# Patient Record
Sex: Female | Born: 2000 | Race: White | Hispanic: No | Marital: Single | Smoking: Never smoker
Health system: Southern US, Community
[De-identification: ages and names within clinical notes are randomized; demographics above are authoritative.]

## PROBLEM LIST (undated history)

## (undated) DIAGNOSIS — M1990 Unspecified osteoarthritis, unspecified site: Secondary | ICD-10-CM

## (undated) DIAGNOSIS — J45909 Unspecified asthma, uncomplicated: Secondary | ICD-10-CM

## (undated) DIAGNOSIS — M4306 Spondylolysis, lumbar region: Secondary | ICD-10-CM

## (undated) DIAGNOSIS — O139 Gestational [pregnancy-induced] hypertension without significant proteinuria, unspecified trimester: Secondary | ICD-10-CM

## (undated) HISTORY — PX: NO PAST SURGERIES: SHX2092

## (undated) HISTORY — DX: Spondylolysis, lumbar region: M43.06

---
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
## 2020-07-24 ENCOUNTER — Encounter: Payer: Self-pay | Admitting: Emergency Medicine

## 2020-07-24 ENCOUNTER — Other Ambulatory Visit: Payer: Self-pay

## 2020-07-24 ENCOUNTER — Emergency Department
Admission: EM | Admit: 2020-07-24 | Discharge: 2020-07-24 | Disposition: A | Discharge status: Home / Self Care | Payer: No Typology Code available for payment source | Attending: Emergency Medicine | Admitting: Emergency Medicine

## 2020-07-24 ENCOUNTER — Emergency Department: Payer: No Typology Code available for payment source

## 2020-07-24 DIAGNOSIS — R079 Chest pain, unspecified: Secondary | ICD-10-CM | POA: Diagnosis not present

## 2020-07-24 HISTORY — DX: Unspecified osteoarthritis, unspecified site: M19.90

## 2020-07-24 LAB — BASIC METABOLIC PANEL
Anion gap: 10 (ref 5–15)
BUN: 10 mg/dL (ref 6–20)
CO2: 24 mmol/L (ref 22–32)
Calcium: 9 mg/dL (ref 8.9–10.3)
Chloride: 106 mmol/L (ref 98–111)
Creatinine, Ser: 0.79 mg/dL (ref 0.44–1.00)
GFR, Estimated: >60 mL/min (ref >60)
Glucose, Bld: 90 mg/dL (ref 70–99)
Potassium: 3.6 mmol/L (ref 3.5–5.1)
Sodium: 140 mmol/L (ref 135–145)

## 2020-07-24 LAB — CBC
HCT: 43.4 % (ref 36.0–46.0)
Hemoglobin: 15 g/dL (ref 12.0–15.0)
MCH: 31.3 pg (ref 26.0–34.0)
MCHC: 34.6 g/dL (ref 30.0–36.0)
MCV: 90.4 fL (ref 80.0–100.0)
Platelets: 367 10*3/uL (ref 150–400)
RBC: 4.8 MIL/uL (ref 3.87–5.11)
RDW: 11.9 % (ref 11.5–15.5)
WBC: 7.5 10*3/uL (ref 4.0–10.5)
nRBC: 0 % (ref 0.0–0.2)

## 2020-07-24 LAB — POCT PREGNANCY, URINE: Preg Test, Ur: NEGATIVE

## 2020-07-24 LAB — HEPATIC FUNCTION PANEL
ALT: 16 U/L (ref 0–44)
AST: 17 U/L (ref 15–41)
Albumin: 4.1 g/dL (ref 3.5–5.0)
Alkaline Phosphatase: 72 U/L (ref 38–126)
Bilirubin, Direct: <0.1 mg/dL (ref 0.0–0.2)
Total Bilirubin: 0.6 mg/dL (ref 0.3–1.2)
Total Protein: 7.3 g/dL (ref 6.5–8.1)

## 2020-07-24 LAB — TROPONIN I (HIGH SENSITIVITY)
Troponin I (High Sensitivity): <2 ng/L (ref <18)
Troponin I (High Sensitivity): <2 ng/L (ref <18)

## 2020-07-24 LAB — LIPASE, BLOOD: Lipase: 41 U/L (ref 11–51)

## 2020-07-24 MED ORDER — ACETAMINOPHEN 500 MG PO TABS
1000.0000 mg | ORAL_TABLET | Freq: Once | ORAL | Status: AC
  Administered 2020-07-24: 1000 mg via ORAL
  Filled 2020-07-24: qty 2

## 2020-07-24 MED ORDER — PANTOPRAZOLE SODIUM 20 MG PO TBEC: 20.0000 mg | DELAYED_RELEASE_TABLET | Freq: Every day | ORAL | 1 refills | Status: DC

## 2020-07-24 NOTE — ED Notes (Signed)
Discharge instructions reviewed with patient , pt AOx4, denied pain or sob

## 2020-07-24 NOTE — ED Triage Notes (Signed)
PT to ER with c/o intermittent chest pain for last several weeks.  States last night and again today it was more severe than normal.  Pt reports nausea and diaphoresis.  States pain is beginning to subside at this time.Pt reports midsternal pain that radiates to left shoulder and center of back.

## 2020-07-24 NOTE — ED Provider Notes (Signed)
[facility] Emergency Department Provider Note  Time seen: 4:55 PM  I have reviewed the triage vital signs and the nursing notes.   HISTORY  Chief Complaint Chest Pain   HPI Tanya Schmitt is a 19 y.o. female with no past medical history presents emergency department for chest pain.  According to the patient for the past several weeks she has been experiencing intermittent pain in the center of her chest radiating to her back her left shoulder at times.  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  Patient denies any fever cough or shortness of breath.  Denies any history of cardiac disease.  No leg pain or swelling.  Does not take birth control.  No pleuritic pain.   Patient states the pain is worse at night when she lies down.  Denies any chest pain currently.   Past Medical History:  Diagnosis Date  . Arthritis     There are no problems to display for this patient.   History reviewed. No pertinent surgical history.  Prior to Admission medications   Not on File    Allergies  Allergen Reactions  . Keflex [Cephalexin] Hives  . Penicillins Hives  . Sulfa Antibiotics Hives    History reviewed. No pertinent family history.  Social History Social History   Tobacco Use  . Smoking status: Never Smoker  . Smokeless tobacco: Never Used  Substance Use Topics  . Alcohol use: Not on file  . Drug use: Not on file    Review of Systems Constitutional: Negative for fever. Cardiovascular: Intermittent mild "tension" type pain in the center of her chest. Respiratory: Negative for shortness of breath. Gastrointestinal: Negative for abdominal pain, vomiting  Genitourinary: Negative for urinary compaints Musculoskeletal: Negative for musculoskeletal  complaints.  Negative for leg pain or swelling. Skin: Negative for skin complaints  Neurological: Negative for headache All other ROS negative  ____________________________________________   PHYSICAL EXAM:  VITAL SIGNS: ED Triage Vitals [07/24/20 1325]  Enc Vitals Group     BP 102/67     Pulse Rate (!) 111     Resp 18     Temp 98.2 F (36.8 C)     Temp Source Oral     SpO2 99 %     Weight 250 lb (113.4 kg)     Height 5\' 3"  (1.6 m)     Head Circumference      Peak Flow      Pain Score 5     Pain Loc      Pain Edu?      Excl. in GC?    Constitutional: Alert and oriented. Well appearing and in no distress. Eyes: Normal exam ENT      Head: Normocephalic and atraumatic.      Mouth/Throat: Mucous membranes are moist. Cardiovascular: Normal rate, regular rhythm.  Respiratory: Normal respiratory effort without tachypnea nor retractions. Breath sounds are clear Gastrointestinal: Soft and nontender. No distention.   Musculoskeletal: Nontender with normal range of motion in all extremities. No lower extremity tenderness or edema. Neurologic:  Normal speech and language. No gross focal neurologic deficit Skin:  Skin is warm, dry and intact.  Psychiatric: Mood and affect are normal.   ____________________________________________    EKG  EKG viewed and interpreted by myself shows sinus tachycardia  104 bpm with a narrow QRS, normal axis, normal intervals, no concerning ST changes.  ____________________________________________    RADIOLOGY  Chest x-ray is negative  ____________________________________________   INITIAL IMPRESSION / ASSESSMENT AND PLAN / ED COURSE  Pertinent labs & imaging results that were available during my care of the patient were reviewed by me and considered in my medical decision making (see chart for details).   Patient presents to the emergency department for intermittent chest pain over the past several weeks.  Patient states it occurred today  and she began feeling a pins-and-needles sensation checked her vital signs and they were abnormal so she came to the emergency department.  Denies any chest pain currently.  Overall the patient does appear well, chest pain is reproducible on palpation of the chest.  EKG is reassuring, chest x-ray appears normal.  Lab work is large within normal limits including a negative troponin.  Patient did have slight epigastric tenderness on my examination as well.  I have added on a lipase and hepatic function panel as a precaution.  If these are normal I do believe the patient will be safe for discharge home with PCP follow-up.  Patient agreeable to plan of care.  Given the pain occurs mostly at night and the patient does have a history of gastric reflux per patient we will prescribe a PPI if her remaining work-up is negative.  LFTs and lipase are negative.  We will place the patient on Protonix and have the patient follow-up with her PCP.  Patient agreeable to plan of care.  Discussed my typical chest pain return precautions.  Tanya Schmitt was evaluated in Emergency Department on 07/24/2020 for the symptoms described in the history of present illness. She was evaluated in the context of the global COVID-19 pandemic, which necessitated consideration that the patient might be at risk for infection with the SARS-CoV-2 virus that causes COVID-19. Institutional protocols and algorithms that pertain to the evaluation of patients at risk for COVID-19 are in a state of rapid change based on information released by regulatory bodies including the CDC and federal and state organizations. These policies and algorithms were followed during the patient's care in the ED.  ____________________________________________   FINAL CLINICAL IMPRESSION(S) / ED DIAGNOSES  Chest pain   Minna Antis, MD 07/24/20 1757

## 2020-10-17 NOTE — L&D Delivery Note (Signed)
Delivery Note At 9:19 PM a viable female was delivered via Vaginal, Spontaneous (Presentation: ROA).  APGAR: 8, 9; weight pending.   Placenta status: Spontaneous, Intact. 3V Cord  with the following complications: none.  Cord pH: n/a  Anesthesia: Epidural Episiotomy: None Lacerations:  vaginal and bilateral periurethral Suture Repair: 3.0 vicryl rapide Est. Blood Loss (mL):  150  Mom to postpartum.  Baby to Couplet care / Skin to Skin.  Mitchel Honour 07/07/2021, 9:49 PM

## 2020-12-10 LAB — OB RESULTS CONSOLE RUBELLA ANTIBODY, IGM: Rubella: IMMUNE

## 2021-04-21 LAB — OB RESULTS CONSOLE HIV ANTIBODY (ROUTINE TESTING): HIV: NONREACTIVE

## 2021-06-25 ENCOUNTER — Encounter: Payer: Self-pay | Admitting: Advanced Practice Midwife

## 2021-06-25 ENCOUNTER — Inpatient Hospital Stay (HOSPITAL_COMMUNITY)
Admission: AD | Admit: 2021-06-25 | Discharge: 2021-06-25 | Disposition: A | Discharge status: Home / Self Care | Payer: No Typology Code available for payment source | Attending: Obstetrics and Gynecology | Admitting: Obstetrics and Gynecology

## 2021-06-25 ENCOUNTER — Other Ambulatory Visit: Payer: Self-pay

## 2021-06-25 DIAGNOSIS — Z3A36 36 weeks gestation of pregnancy: Secondary | ICD-10-CM | POA: Diagnosis not present

## 2021-06-25 DIAGNOSIS — N898 Other specified noninflammatory disorders of vagina: Secondary | ICD-10-CM | POA: Insufficient documentation

## 2021-06-25 DIAGNOSIS — Z881 Allergy status to other antibiotic agents status: Secondary | ICD-10-CM | POA: Insufficient documentation

## 2021-06-25 DIAGNOSIS — Z882 Allergy status to sulfonamides status: Secondary | ICD-10-CM | POA: Diagnosis not present

## 2021-06-25 DIAGNOSIS — R109 Unspecified abdominal pain: Secondary | ICD-10-CM

## 2021-06-25 DIAGNOSIS — R1084 Generalized abdominal pain: Secondary | ICD-10-CM | POA: Insufficient documentation

## 2021-06-25 DIAGNOSIS — O26893 Other specified pregnancy related conditions, third trimester: Secondary | ICD-10-CM | POA: Diagnosis not present

## 2021-06-25 DIAGNOSIS — Z880 Allergy status to penicillin: Secondary | ICD-10-CM | POA: Insufficient documentation

## 2021-06-25 DIAGNOSIS — I10 Essential (primary) hypertension: Secondary | ICD-10-CM | POA: Diagnosis not present

## 2021-06-25 DIAGNOSIS — R141 Gas pain: Secondary | ICD-10-CM | POA: Insufficient documentation

## 2021-06-25 HISTORY — DX: Unspecified asthma, uncomplicated: J45.909

## 2021-06-25 LAB — URINALYSIS, ROUTINE W REFLEX MICROSCOPIC
Bilirubin Urine: NEGATIVE
Glucose, UA: NEGATIVE mg/dL
Hgb urine dipstick: NEGATIVE
Ketones, ur: NEGATIVE mg/dL
Leukocytes,Ua: NEGATIVE
Nitrite: NEGATIVE
Protein, ur: NEGATIVE mg/dL
Specific Gravity, Urine: 1.009 (ref 1.005–1.030)
pH: 6 (ref 5.0–8.0)

## 2021-06-25 LAB — COMPREHENSIVE METABOLIC PANEL
ALT: 11 U/L (ref 0–44)
AST: 16 U/L (ref 15–41)
Albumin: 2.7 g/dL — ABNORMAL LOW (ref 3.5–5.0)
Alkaline Phosphatase: 122 U/L (ref 38–126)
Anion gap: 8 (ref 5–15)
BUN: 7 mg/dL (ref 6–20)
CO2: 22 mmol/L (ref 22–32)
Calcium: 8.8 mg/dL — ABNORMAL LOW (ref 8.9–10.3)
Chloride: 106 mmol/L (ref 98–111)
Creatinine, Ser: 0.57 mg/dL (ref 0.44–1.00)
GFR, Estimated: >60 mL/min (ref >60)
Glucose, Bld: 93 mg/dL (ref 70–99)
Potassium: 3.6 mmol/L (ref 3.5–5.1)
Sodium: 136 mmol/L (ref 135–145)
Total Bilirubin: 0.4 mg/dL (ref 0.3–1.2)
Total Protein: 6.3 g/dL — ABNORMAL LOW (ref 6.5–8.1)

## 2021-06-25 LAB — CBC
HCT: 32.3 % — ABNORMAL LOW (ref 36.0–46.0)
Hemoglobin: 10.8 g/dL — ABNORMAL LOW (ref 12.0–15.0)
MCH: 31 pg (ref 26.0–34.0)
MCHC: 33.4 g/dL (ref 30.0–36.0)
MCV: 92.8 fL (ref 80.0–100.0)
Platelets: 287 10*3/uL (ref 150–400)
RBC: 3.48 MIL/uL — ABNORMAL LOW (ref 3.87–5.11)
RDW: 12.6 % (ref 11.5–15.5)
WBC: 11.6 10*3/uL — ABNORMAL HIGH (ref 4.0–10.5)
nRBC: 0 % (ref 0.0–0.2)

## 2021-06-25 LAB — PROTEIN / CREATININE RATIO, URINE
Creatinine, Urine: 64.05 mg/dL
Protein Creatinine Ratio: 0.09 mg/mg{Cre} (ref 0.00–0.15)
Total Protein, Urine: 6 mg/dL

## 2021-06-25 LAB — AMNISURE RUPTURE OF MEMBRANE (ROM) NOT AT ARMC: Amnisure ROM: NEGATIVE

## 2021-06-25 MED ORDER — SIMETHICONE 80 MG PO CHEW: 80.0000 mg | CHEWABLE_TABLET | Freq: Four times a day (QID) | ORAL | 0 refills | Status: DC | PRN

## 2021-06-25 MED ORDER — SIMETHICONE 80 MG PO CHEW
160.0000 mg | CHEWABLE_TABLET | Freq: Once | ORAL | Status: AC
  Administered 2021-06-25: 160 mg via ORAL
  Filled 2021-06-25: qty 2

## 2021-06-25 NOTE — Progress Notes (Signed)
Marie Williams CNM in earlier to discuss test results and d/c plan. Written and verbal d/c instructions given and understanding voiced °

## 2021-06-25 NOTE — MAU Provider Note (Signed)
Chief Complaint:  Abdominal Pain   Event Date/Time   First Provider Initiated Contact with Patient 06/25/21 0110     HPI: Tanya Schmitt is a 20 y.o. G1P0 at 79w0dwho presents to maternity admissions reporting intermittent sharp abdominal pain (now resolved, thinks is gas pains),  and possible leaking of fluid today "like I peed on myself".  . She reports good fetal movement, denies vaginal bleeding, vaginal itching/burning, urinary symptoms, h/a, dizziness, n/v, diarrhea, constipation or fever/chills.  She denies headache, visual changes or RUQ abdominal pain.  Abdominal Pain This is a new problem. The current episode started today. The problem occurs intermittently. The problem has been resolved since onset. The pain is located in the generalized abdominal region. The quality of the pain is described as sharp (gas pains). The pain does not radiate. Pertinent negatives include no constipation, diarrhea, dysuria, fever or frequency. Nothing relieves the symptoms. Past treatments include nothing.   RN Note: Having some sharp pain that comes and goes in mid abdomen. Thought it was gas and tried to have BM but could not. Had normal BM earlier today. Denies VB but has had some leaking on occ last couple days. Thought may be urine but pt unsure.  Past Medical History: Past Medical History:  Diagnosis Date   Arthritis    Asthma     Past obstetric history: OB History  Gravida Para Term Preterm AB Living  1            SAB IAB Ectopic Multiple Live Births               # Outcome Date GA Lbr Len/2nd Weight Sex Delivery Anes PTL Lv  1 Current             Past Surgical History: Past Surgical History:  Procedure Laterality Date   NO PAST SURGERIES      Family History: Family History  Problem Relation Age of Onset   Stroke Mother    Heart attack Mother     Social History: Social History   Tobacco Use   Smoking status: Never   Smokeless tobacco: Never  Vaping Use   Vaping Use:  Never used  Substance Use Topics   Alcohol use: Never   Drug use: Never    Allergies:  Allergies  Allergen Reactions   Keflex [Cephalexin] Hives   Penicillins Hives   Sulfa Antibiotics Hives    Meds:  Medications Prior to Admission  Medication Sig Dispense Refill Last Dose   pantoprazole (PROTONIX) 20 MG tablet Take 1 tablet (20 mg total) by mouth daily. 30 tablet 1     I have reviewed patient's Past Medical Hx, Surgical Hx, Family Hx, Social Hx, medications and allergies.   ROS:  Review of Systems  Constitutional:  Negative for fever.  Gastrointestinal:  Positive for abdominal pain. Negative for constipation and diarrhea.  Genitourinary:  Negative for dysuria and frequency.  Other systems negative  Physical Exam  Patient Vitals for the past 24 hrs:  BP Temp Pulse Resp SpO2 Height Weight  06/25/21 0058 140/83 -- (!) 106 -- -- -- --  06/25/21 0045 130/89 -- (!) 122 -- -- -- --  06/25/21 0025 133/82 -- (!) 111 -- 99 % -- --  06/25/21 0024 -- 98 F (36.7 C) -- 20 -- 5\' 3"  (1.6 m) 116.1 kg   Constitutional: Well-developed, well-nourished female in no acute distress.  Cardiovascular: normal rate and rhythm Respiratory: normal effort, clear to auscultation bilaterally GI: Abd soft,  non-tender, gravid appropriate for gestational age.   No rebound or guarding. MS: Extremities nontender, no edema, normal ROM Neurologic: Alert and oriented x 4.  GU: Neg CVAT.  PELVIC EXAM:   Dilation: Closed Effacement (%): 40 Exam by:: Wynelle Bourgeois CNM Amnisure sent  FHT:  Baseline 140 , moderate variability, accelerations present, no decelerations Contractions:  Irregular, not painful    Labs: Results for orders placed or performed during the hospital encounter of 06/25/21 (from the past 24 hour(s))  Urinalysis, Routine w reflex microscopic Urine, Clean Catch     Status: None   Collection Time: 06/25/21 12:35 AM  Result Value Ref Range   Color, Urine YELLOW YELLOW   APPearance  CLEAR CLEAR   Specific Gravity, Urine 1.009 1.005 - 1.030   pH 6.0 5.0 - 8.0   Glucose, UA NEGATIVE NEGATIVE mg/dL   Hgb urine dipstick NEGATIVE NEGATIVE   Bilirubin Urine NEGATIVE NEGATIVE   Ketones, ur NEGATIVE NEGATIVE mg/dL   Protein, ur NEGATIVE NEGATIVE mg/dL   Nitrite NEGATIVE NEGATIVE   Leukocytes,Ua NEGATIVE NEGATIVE  Protein / creatinine ratio, urine     Status: None   Collection Time: 06/25/21 12:35 AM  Result Value Ref Range   Creatinine, Urine 64.05 mg/dL   Total Protein, Urine 6 mg/dL   Protein Creatinine Ratio 0.09 0.00 - 0.15 mg/mg[Cre]  Amnisure rupture of membrane (rom)not at [facility]     Status: None   Collection Time: 06/25/21  1:20 AM  Result Value Ref Range   Amnisure ROM NEGATIVE   CBC     Status: Abnormal   Collection Time: 06/25/21  2:00 AM  Result Value Ref Range   WBC 11.6 (H) 4.0 - 10.5 K/uL   RBC 3.48 (L) 3.87 - 5.11 MIL/uL   Hemoglobin 10.8 (L) 12.0 - 15.0 g/dL   HCT 10.2 (L) 72.5 - 36.6 %   MCV 92.8 80.0 - 100.0 fL   MCH 31.0 26.0 - 34.0 pg   MCHC 33.4 30.0 - 36.0 g/dL   RDW 44.0 34.7 - 42.5 %   Platelets 287 150 - 400 K/uL   nRBC 0.0 0.0 - 0.2 %  Comprehensive metabolic panel     Status: Abnormal   Collection Time: 06/25/21  2:00 AM  Result Value Ref Range   Sodium 136 135 - 145 mmol/L   Potassium 3.6 3.5 - 5.1 mmol/L   Chloride 106 98 - 111 mmol/L   CO2 22 22 - 32 mmol/L   Glucose, Bld 93 70 - 99 mg/dL   BUN 7 6 - 20 mg/dL   Creatinine, Ser 9.56 0.44 - 1.00 mg/dL   Calcium 8.8 (L) 8.9 - 10.3 mg/dL   Total Protein 6.3 (L) 6.5 - 8.1 g/dL   Albumin 2.7 (L) 3.5 - 5.0 g/dL   AST 16 15 - 41 U/L   ALT 11 0 - 44 U/L   Alkaline Phosphatase 122 38 - 126 U/L   Total Bilirubin 0.4 0.3 - 1.2 mg/dL   GFR, Estimated >38 >75 mL/min   Anion gap 8 5 - 15   Imaging:  No results found.  MAU Course/MDM: I have ordered labs and reviewed results. Labs are normal Normal Pr/Cr ratio NST reviewed, reactive Treatments in MAU included EFM, simethicone     Assessment: Single IUP at [redacted]w[redacted]d Abdominal gas pains Intermittent hypertension, pt states they are following in office No signs of preeclampsia  Plan: Discharge home Rx Simethicone for gas pains Preeclampsia precautions Labor precautions and fetal kick counts  Follow up in Office for prenatal visits and recheck Encouraged to return if she develops worsening of symptoms, increase in pain, fever, or other concerning symptoms.  Pt stable at time of discharge.  Wynelle Bourgeois CNM, MSN Certified Nurse-Midwife 06/25/2021 1:10 AM

## 2021-06-25 NOTE — MAU Note (Signed)
OK to d/c EFM per M Williams CNM 

## 2021-06-25 NOTE — MAU Note (Signed)
Having some sharp pain that comes and goes in mid abdomen. Thought it was gas and tried to have BM but could not. Had normal BM earlier today. Denies VB but has had some leaking on occ last couple days. Thought may be urine but pt unsure.

## 2021-06-25 NOTE — MAU Note (Signed)
Pt states she was to take baby asa but she was bleeding so easily she stopped it. She was on Labetalol 2nd trimester and was told to stop taking it that it was not making a difference.

## 2021-06-30 LAB — OB RESULTS CONSOLE GBS: GBS: NEGATIVE

## 2021-07-01 ENCOUNTER — Inpatient Hospital Stay (HOSPITAL_COMMUNITY)
Admission: AD | Admit: 2021-07-01 | Discharge: 2021-07-01 | Disposition: A | Discharge status: Home / Self Care | Payer: No Typology Code available for payment source | Attending: Obstetrics and Gynecology | Admitting: Obstetrics and Gynecology

## 2021-07-01 ENCOUNTER — Encounter (HOSPITAL_COMMUNITY): Payer: Self-pay | Admitting: Obstetrics and Gynecology

## 2021-07-01 DIAGNOSIS — Z3A36 36 weeks gestation of pregnancy: Secondary | ICD-10-CM | POA: Insufficient documentation

## 2021-07-01 DIAGNOSIS — Z3403 Encounter for supervision of normal first pregnancy, third trimester: Secondary | ICD-10-CM | POA: Diagnosis not present

## 2021-07-01 DIAGNOSIS — Z0371 Encounter for suspected problem with amniotic cavity and membrane ruled out: Secondary | ICD-10-CM | POA: Diagnosis not present

## 2021-07-01 LAB — OB RESULTS CONSOLE GC/CHLAMYDIA
Chlamydia: NEGATIVE
Gonorrhea: NEGATIVE

## 2021-07-01 LAB — POCT FERN TEST: POCT Fern Test: NEGATIVE

## 2021-07-01 NOTE — MAU Note (Signed)
Pt reports she has a gush of fluid around 330pm. Clear fluid. Reports some ctx. Good fetal movement. Denies any vag bleeding.

## 2021-07-01 NOTE — MAU Provider Note (Signed)
Event Date/Time   First Provider Initiated Contact with Patient 07/01/21 1817       S: Ms. AISHA GREENBERGER is a 20 y.o. G1P0 at [redacted]w[redacted]d  who presents to MAU today complaining of leaking of fluid since. Reports a gush of fluid earlier today that caused her underwear to be wet. No odor. Denies contractions, dysuria, vaginal discharge, vaginal irritation, or vaginal bleeding. No recent intercourse. Good fetal movement.   O: BP 113/80 (BP Location: Right Arm)   Pulse (!) 112   Temp 98.2 F (36.8 C)   Resp 17   LMP 07/14/2020   SpO2 99%  GENERAL: Well-developed, well-nourished female in no acute distress.  HEAD: Normocephalic, atraumatic.  CHEST: Normal effort of breathing, regular heart rate ABDOMEN: Soft, nontender, gravid PELVIC: Normal external female genitalia. Vagina is pink and rugated. Cervix with normal contour, no lesions. Normal discharge.  No pooling.   Cervical exam: not indicated     Fetal Monitoring: Baseline: 135 Variability: moderate Accelerations: 15x15 Decelerations: none Contractions: irregular  Results for orders placed or performed during the hospital encounter of 07/01/21 (from the past 24 hour(s))  POCT fern test     Status: None   Collection Time: 07/01/21  6:37 PM  Result Value Ref Range   POCT Fern Test Negative = intact amniotic membranes      A: SIUP at [redacted]w[redacted]d  Membranes intact  P: Discharge home Labor precautions Keep f/u with Virgia Land, NP 07/01/2021 6:37 PM

## 2021-07-05 NOTE — H&P (Signed)
Tanya Schmitt is a 20 y.o. female presenting for IOL. Pregnancy complicated by elevated BP. Evaluated twice in MAU for BP. Today at PCP office BP = 173/91, 123/101. Today in our office BP = 132/88, ptn trace. She has some HA on/off and some puffiness in her face. OB History     Gravida  1   Para      Term      Preterm      AB      Living         SAB      IAB      Ectopic      Multiple      Live Births             Past Medical History:  Diagnosis Date   Arthritis    Asthma    Past Surgical History:  Procedure Laterality Date   NO PAST SURGERIES     Family History: family history includes Heart attack in her mother; Stroke in her mother. Social History:  reports that she has never smoked. She has never used smokeless tobacco. She reports that she does not drink alcohol and does not use drugs.     Maternal Diabetes: No Genetic Screening: Normal Maternal Ultrasounds/Referrals: Normal Fetal Ultrasounds or other Referrals:  None Maternal Substance Abuse:  No Significant Maternal Medications:  None Significant Maternal Lab Results:  Group B Strep negative Other Comments:  None  Review of Systems  Constitutional:  Negative for fever.  Eyes:  Positive for visual disturbance.       Blurred vision off/on  Gastrointestinal:  Negative for abdominal pain.  Maternal Medical History:  Fetal activity: Perceived fetal activity is normal.      Last menstrual period 07/14/2020. Maternal Exam:  Abdomen: Fetal presentation: vertex  Physical Exam Cardiovascular:     Rate and Rhythm: Normal rate.  Pulmonary:     Effort: Pulmonary effort is normal.    Prenatal labs: ABO, Rh:   Antibody:   Rubella:   RPR:    HBsAg:    HIV:    GBS:   negative 06/30/21  Assessment/Plan: 20 yo G1P0 with gestational hypertension becoming more labile Admitted for two stage IOL   Roselle Locus II 07/05/2021, 6:17 PM

## 2021-07-06 ENCOUNTER — Inpatient Hospital Stay (HOSPITAL_COMMUNITY)
Admission: AD | Admit: 2021-07-06 | Discharge: 2021-07-09 | DRG: 807 | Disposition: A | Discharge status: Home / Self Care | Payer: No Typology Code available for payment source | Attending: Obstetrics & Gynecology | Admitting: Obstetrics & Gynecology

## 2021-07-06 ENCOUNTER — Encounter (HOSPITAL_COMMUNITY): Payer: Self-pay | Admitting: Obstetrics and Gynecology

## 2021-07-06 ENCOUNTER — Inpatient Hospital Stay (HOSPITAL_COMMUNITY): Payer: No Typology Code available for payment source

## 2021-07-06 ENCOUNTER — Other Ambulatory Visit: Payer: Self-pay

## 2021-07-06 DIAGNOSIS — Z3A37 37 weeks gestation of pregnancy: Secondary | ICD-10-CM | POA: Diagnosis not present

## 2021-07-06 DIAGNOSIS — O139 Gestational [pregnancy-induced] hypertension without significant proteinuria, unspecified trimester: Secondary | ICD-10-CM | POA: Diagnosis present

## 2021-07-06 DIAGNOSIS — Z3490 Encounter for supervision of normal pregnancy, unspecified, unspecified trimester: Secondary | ICD-10-CM

## 2021-07-06 DIAGNOSIS — O99214 Obesity complicating childbirth: Secondary | ICD-10-CM | POA: Diagnosis present

## 2021-07-06 DIAGNOSIS — O134 Gestational [pregnancy-induced] hypertension without significant proteinuria, complicating childbirth: Secondary | ICD-10-CM | POA: Diagnosis present

## 2021-07-06 DIAGNOSIS — Z20822 Contact with and (suspected) exposure to covid-19: Secondary | ICD-10-CM | POA: Diagnosis present

## 2021-07-06 DIAGNOSIS — R030 Elevated blood-pressure reading, without diagnosis of hypertension: Secondary | ICD-10-CM | POA: Diagnosis present

## 2021-07-06 HISTORY — DX: Gestational (pregnancy-induced) hypertension without significant proteinuria, unspecified trimester: O13.9

## 2021-07-06 LAB — TYPE AND SCREEN
ABO/RH(D): B NEG
Antibody Screen: POSITIVE

## 2021-07-06 LAB — CBC
HCT: 32 % — ABNORMAL LOW (ref 36.0–46.0)
Hemoglobin: 10.7 g/dL — ABNORMAL LOW (ref 12.0–15.0)
MCH: 30.7 pg (ref 26.0–34.0)
MCHC: 33.4 g/dL (ref 30.0–36.0)
MCV: 92 fL (ref 80.0–100.0)
Platelets: 294 10*3/uL (ref 150–400)
RBC: 3.48 MIL/uL — ABNORMAL LOW (ref 3.87–5.11)
RDW: 12.7 % (ref 11.5–15.5)
WBC: 11.5 10*3/uL — ABNORMAL HIGH (ref 4.0–10.5)
nRBC: 0 % (ref 0.0–0.2)

## 2021-07-06 LAB — COMPREHENSIVE METABOLIC PANEL
ALT: 9 U/L (ref 0–44)
AST: 20 U/L (ref 15–41)
Albumin: 2.8 g/dL — ABNORMAL LOW (ref 3.5–5.0)
Alkaline Phosphatase: 138 U/L — ABNORMAL HIGH (ref 38–126)
Anion gap: 11 (ref 5–15)
BUN: 5 mg/dL — ABNORMAL LOW (ref 6–20)
CO2: 19 mmol/L — ABNORMAL LOW (ref 22–32)
Calcium: 8.7 mg/dL — ABNORMAL LOW (ref 8.9–10.3)
Chloride: 105 mmol/L (ref 98–111)
Creatinine, Ser: 0.52 mg/dL (ref 0.44–1.00)
GFR, Estimated: >60 mL/min (ref >60)
Glucose, Bld: 87 mg/dL (ref 70–99)
Potassium: 3.6 mmol/L (ref 3.5–5.1)
Sodium: 135 mmol/L (ref 135–145)
Total Bilirubin: 0.6 mg/dL (ref 0.3–1.2)
Total Protein: 6.1 g/dL — ABNORMAL LOW (ref 6.5–8.1)

## 2021-07-06 LAB — RESP PANEL BY RT-PCR (FLU A&B, COVID) ARPGX2
Influenza A by PCR: NEGATIVE
Influenza B by PCR: NEGATIVE
SARS Coronavirus 2 by RT PCR: NEGATIVE

## 2021-07-06 LAB — RPR: RPR Ser Ql: NONREACTIVE

## 2021-07-06 MED ORDER — OXYTOCIN-SODIUM CHLORIDE 30-0.9 UT/500ML-% IV SOLN: 2.5000 [IU]/h | INTRAVENOUS | Status: DC

## 2021-07-06 MED ORDER — MISOPROSTOL 25 MCG QUARTER TABLET
25.0000 ug | ORAL_TABLET | ORAL | Status: DC | PRN
  Administered 2021-07-06 (×2): 25 ug via VAGINAL
  Filled 2021-07-06 (×2): qty 1

## 2021-07-06 MED ORDER — LACTATED RINGERS IV SOLN: 500.0000 mL | INTRAVENOUS | Status: DC | PRN

## 2021-07-06 MED ORDER — OXYTOCIN BOLUS FROM INFUSION
333.0000 mL | Freq: Once | INTRAVENOUS | Status: AC
  Administered 2021-07-07: 333 mL via INTRAVENOUS

## 2021-07-06 MED ORDER — FENTANYL CITRATE (PF) 100 MCG/2ML IJ SOLN
50.0000 ug | INTRAMUSCULAR | Status: DC | PRN
  Administered 2021-07-07: 100 ug via INTRAVENOUS
  Filled 2021-07-06: qty 2

## 2021-07-06 MED ORDER — TERBUTALINE SULFATE 1 MG/ML IJ SOLN: 0.2500 mg | Freq: Once | INTRAMUSCULAR | Status: DC | PRN

## 2021-07-06 MED ORDER — OXYCODONE-ACETAMINOPHEN 5-325 MG PO TABS
1.0000 | ORAL_TABLET | ORAL | Status: DC | PRN
  Administered 2021-07-07: 1 via ORAL
  Filled 2021-07-06: qty 1

## 2021-07-06 MED ORDER — LACTATED RINGERS IV SOLN: INTRAVENOUS | Status: DC

## 2021-07-06 MED ORDER — FLEET ENEMA 7-19 GM/118ML RE ENEM: 1.0000 | ENEMA | RECTAL | Status: DC | PRN

## 2021-07-06 MED ORDER — SOD CITRATE-CITRIC ACID 500-334 MG/5ML PO SOLN: 30.0000 mL | ORAL | Status: DC | PRN

## 2021-07-06 MED ORDER — ZOLPIDEM TARTRATE 5 MG PO TABS: 5.0000 mg | ORAL_TABLET | Freq: Every evening | ORAL | Status: DC | PRN

## 2021-07-06 MED ORDER — ONDANSETRON HCL 4 MG/2ML IJ SOLN
4.0000 mg | Freq: Four times a day (QID) | INTRAMUSCULAR | Status: DC | PRN
  Administered 2021-07-07 (×2): 4 mg via INTRAVENOUS
  Filled 2021-07-06 (×2): qty 2

## 2021-07-06 MED ORDER — OXYCODONE-ACETAMINOPHEN 5-325 MG PO TABS: 2.0000 | ORAL_TABLET | ORAL | Status: DC | PRN

## 2021-07-06 MED ORDER — MISOPROSTOL 50MCG HALF TABLET
50.0000 ug | ORAL_TABLET | ORAL | Status: DC | PRN
  Administered 2021-07-06 (×3): 50 ug via ORAL
  Filled 2021-07-06 (×3): qty 1

## 2021-07-06 MED ORDER — LIDOCAINE HCL (PF) 1 % IJ SOLN: 30.0000 mL | INTRAMUSCULAR | Status: DC | PRN

## 2021-07-06 MED ORDER — ACETAMINOPHEN 325 MG PO TABS
650.0000 mg | ORAL_TABLET | ORAL | Status: DC | PRN
Start: 2021-07-06 — End: 2021-07-07

## 2021-07-06 NOTE — Progress Notes (Signed)
Patient is hungry No HA, no vision change  Today's Vitals   07/06/21 1802 07/06/21 1900 07/06/21 2002 07/06/21 2102  BP: 130/77 135/74 126/82 126/75  Pulse: 98 94 (!) 101 91  Resp:      Temp:      TempSrc:      SpO2:      Weight:      Height:      PainSc:   5     Body mass index is 45.37 kg/m.   FHT cat one UCs q2-5 min, she is feeling some of them Cx 1/60/-2/vtx/a little softer  A/P: D/W options         Will hold misoprostol and she will eat a meal         If no spontaneous change will check cervix at 5am

## 2021-07-06 NOTE — Progress Notes (Signed)
FHT cat one UCs q2-4 min, mild Cx FT/50/-2/vtx/ballot/med soft  Today's Vitals   07/06/21 1402 07/06/21 1502 07/06/21 1602 07/06/21 1701  BP: 139/90 117/73 (!) 115/59 121/72  Pulse: (!) 107 (!) 102 95 100  Resp:      Temp:      TempSrc:      SpO2:      Weight:      Height:      PainSc:       Body mass index is 45.37 kg/m.   A/P: S/P cytotec vag x 2 and oral x 2         Repeat cytotec oral

## 2021-07-06 NOTE — Plan of Care (Signed)

## 2021-07-06 NOTE — Progress Notes (Signed)
No HA, no vision change, no ROM  Today's Vitals   07/06/21 0126 07/06/21 0521 07/06/21 0522 07/06/21 0656  BP:  133/69  129/75  Pulse:  97  93  Resp:  18  16  Temp:   98.1 F (36.7 C)   TempSrc:   Oral   SpO2: 98%     Weight:      Height:      PainSc:  Asleep  Asleep   Body mass index is 45.37 kg/m.  DTR 2+  FHT cat one UCs q2-4 min, mild/mod  Cytotec x 2  Results for orders placed or performed during the hospital encounter of 07/06/21 (from the past 24 hour(s))  CBC     Status: Abnormal   Collection Time: 07/06/21 12:30 AM  Result Value Ref Range   WBC 11.5 (H) 4.0 - 10.5 K/uL   RBC 3.48 (L) 3.87 - 5.11 MIL/uL   Hemoglobin 10.7 (L) 12.0 - 15.0 g/dL   HCT 26.3 (L) 33.5 - 45.6 %   MCV 92.0 80.0 - 100.0 fL   MCH 30.7 26.0 - 34.0 pg   MCHC 33.4 30.0 - 36.0 g/dL   RDW 25.6 38.9 - 37.3 %   Platelets 294 150 - 400 K/uL   nRBC 0.0 0.0 - 0.2 %  Type and screen [facility]     Status: None   Collection Time: 07/06/21 12:30 AM  Result Value Ref Range   ABO/RH(D) B NEG    Antibody Screen POS    Sample Expiration 07/09/2021,2359    Antibody Identification      PASSIVELY ACQUIRED ANTI-D Performed at [facility], [address], [place], [place] [address]   Comprehensive metabolic panel     Status: Abnormal   Collection Time: 07/06/21 12:30 AM  Result Value Ref Range   Sodium 135 135 - 145 mmol/L   Potassium 3.6 3.5 - 5.1 mmol/L   Chloride 105 98 - 111 mmol/L   CO2 19 (L) 22 - 32 mmol/L   Glucose, Bld 87 70 - 99 mg/dL   BUN 5 (L) 6 - 20 mg/dL   Creatinine, Ser 8.11 0.44 - 1.00 mg/dL   Calcium 8.7 (L) 8.9 - 10.3 mg/dL   Total Protein 6.1 (L) 6.5 - 8.1 g/dL   Albumin 2.8 (L) 3.5 - 5.0 g/dL   AST 20 15 - 41 U/L   ALT 9 0 - 44 U/L   Alkaline Phosphatase 138 (H) 38 - 126 U/L   Total Bilirubin 0.6 0.3 - 1.2 mg/dL   GFR, Estimated >57 >26 mL/min   Anion gap 11 5 - 15  Resp Panel by RT-PCR (Flu A&B, Covid) Nasopharyngeal Swab     Status: None    Collection Time: 07/06/21  1:25 AM   Specimen: Nasopharyngeal Swab; Nasopharyngeal(NP) swabs in vial transport medium  Result Value Ref Range   SARS Coronavirus 2 by RT PCR NEGATIVE NEGATIVE   Influenza A by PCR NEGATIVE NEGATIVE   Influenza B by PCR NEGATIVE NEGATIVE    A/P: D/W patient two stage IOL        Continue Cytotec per protocol

## 2021-07-07 ENCOUNTER — Inpatient Hospital Stay (HOSPITAL_COMMUNITY): Payer: No Typology Code available for payment source | Admitting: Anesthesiology

## 2021-07-07 MED ORDER — LIDOCAINE-EPINEPHRINE (PF) 1.5 %-1:200000 IJ SOLN
INTRAMUSCULAR | Status: DC | PRN
  Administered 2021-07-07 (×2): 5 mL via EPIDURAL

## 2021-07-07 MED ORDER — OXYTOCIN-SODIUM CHLORIDE 30-0.9 UT/500ML-% IV SOLN
1.0000 m[IU]/min | INTRAVENOUS | Status: DC
Start: 2021-07-07 — End: 2021-07-07
  Administered 2021-07-07: 2 m[IU]/min via INTRAVENOUS
  Filled 2021-07-07 (×2): qty 500

## 2021-07-07 MED ORDER — FENTANYL-BUPIVACAINE-NACL 0.5-0.125-0.9 MG/250ML-% EP SOLN
12.0000 mL/h | EPIDURAL | Status: DC | PRN
Start: 2021-07-07 — End: 2021-07-07
  Administered 2021-07-07: 12 mL/h via EPIDURAL
  Filled 2021-07-07: qty 250

## 2021-07-07 MED ORDER — PHENYLEPHRINE 40 MCG/ML (10ML) SYRINGE FOR IV PUSH (FOR BLOOD PRESSURE SUPPORT): 80.0000 ug | PREFILLED_SYRINGE | INTRAVENOUS | Status: DC | PRN

## 2021-07-07 MED ORDER — EPHEDRINE 5 MG/ML INJ: 10.0000 mg | INTRAVENOUS | Status: DC | PRN

## 2021-07-07 MED ORDER — TERBUTALINE SULFATE 1 MG/ML IJ SOLN: 0.2500 mg | Freq: Once | INTRAMUSCULAR | Status: DC | PRN

## 2021-07-07 MED ORDER — DIPHENHYDRAMINE HCL 50 MG/ML IJ SOLN: 12.5000 mg | INTRAMUSCULAR | Status: DC | PRN

## 2021-07-07 MED ORDER — LACTATED RINGERS IV SOLN
500.0000 mL | Freq: Once | INTRAVENOUS | Status: AC
  Administered 2021-07-07: 500 mL via INTRAVENOUS

## 2021-07-07 MED ORDER — LIDOCAINE HCL (PF) 1 % IJ SOLN
INTRAMUSCULAR | Status: DC | PRN
  Administered 2021-07-07: 5 mL via EPIDURAL
  Administered 2021-07-07: 10 mL via EPIDURAL

## 2021-07-07 NOTE — Progress Notes (Signed)
Tanya Schmitt is a 20 y.o. G1P0 at [redacted]w[redacted]d by ultrasound admitted for induction of labor due to [facility].  Subjective: Comfortable with CLEA.  No HA, vision change, RUQ pain, CP/SOB  Objective: BP 129/69   Pulse 98   Temp 98 F (36.7 C) (Oral)   Resp 18   Ht 5\' 3"  (1.6 m)   Wt 116.2 kg   LMP 07/14/2020   SpO2 100%   BMI 45.37 kg/m  No intake/output data recorded. No intake/output data recorded.  FHT:  FHR: 135 bpm, variability: moderate,  accelerations:  Present,  decelerations:  Absent UC:   difficult to trace; pitocin 16 mU SVE:   Dilation: 6 Effacement (%): 90 Station: -1 Exam by:: Dr. [email or web address] IUPC placed  Labs: Lab Results  Component Value Date   WBC 11.5 (H) 07/06/2021   HGB 10.7 (L) 07/06/2021   HCT 32.0 (L) 07/06/2021   MCV 92.0 07/06/2021   PLT 294 07/06/2021    Assessment / Plan: Induction of labor due to GHTN,  progressing well on pitocin  Labor: Progressing normally; monitor MVUs Preeclampsia:   n/a Fetal Wellbeing:  Category I Pain Control:  Epidural I/D:  n/a Anticipated MOD:  NSVD  07/08/2021 07/07/2021, 6:04 PM

## 2021-07-07 NOTE — Progress Notes (Signed)
Tanya Schmitt is a 20 y.o. G1P0 at [redacted]w[redacted]d by ultrasound admitted for induction of labor due to [facility].  Subjective: Feeling more intensity with CTX; FB out  Objective: BP 125/76   Pulse (!) 103   Temp 98 F (36.7 C) (Oral)   Resp 18   Ht 5\' 3"  (1.6 m)   Wt 116.2 kg   LMP 07/14/2020   SpO2 98%   BMI 45.37 kg/m  No intake/output data recorded. No intake/output data recorded.  FHT:  FHR: 130 bpm, variability: moderate,  accelerations:  Present,  decelerations:  Absent UC:   regular, every 2 minutes SVE:   Dilation: 4 Effacement (%): 70 Station: -1 Exam by:: Dr. [email or web address] AROM for clear fluid  Labs: Lab Results  Component Value Date   WBC 11.5 (H) 07/06/2021   HGB 10.7 (L) 07/06/2021   HCT 32.0 (L) 07/06/2021   MCV 92.0 07/06/2021   PLT 294 07/06/2021    Assessment / Plan: Induction of labor due to GHTN,  progressing well on pitocin  Labor: Progressing normally Preeclampsia:   n/a Fetal Wellbeing:  Category I Pain Control:  IV pain meds I/D:  n/a Anticipated MOD:  NSVD  07/08/2021 07/07/2021, 2:17 PM

## 2021-07-07 NOTE — Lactation Note (Signed)
This note was copied from a baby's chart. Lactation Consultation Note  Patient Name: Tanya Schmitt Today's Date: 07/07/2021   Age:22 hours RNElmarie Shiley) in L&D will call LC on (Vocera) when mom is ready for [facility] assistance, mom is currently having a repair done.   Maternal Data    Feeding    LATCH Score                    Lactation Tools Discussed/Used    Interventions    Discharge    Consult Status      [name] 07/07/2021, 9:30 PM

## 2021-07-07 NOTE — Progress Notes (Signed)
Tanya Schmitt is a 20 y.o. G1P0 at [redacted]w[redacted]d by ultrasound admitted for induction of labor due to [facility].  Subjective: Feeling mild CTX with pitocin.  No HA, vision change, RUQ pain, CP/SOB.  Objective: BP 140/73   Pulse 93   Temp 98 F (36.7 C) (Axillary)   Resp 18   Ht 5\' 3"  (1.6 m)   Wt 116.2 kg   LMP 07/14/2020   SpO2 98%   BMI 45.37 kg/m  No intake/output data recorded. No intake/output data recorded.  FHT:  FHR: 125 bpm, variability: moderate,  accelerations:  Present,  decelerations:  Absent UC:   regular, every 2 minutes, pitocin just increased to 8 mU SVE:   1/50/-3; FB placed  Labs: Lab Results  Component Value Date   WBC 11.5 (H) 07/06/2021   HGB 10.7 (L) 07/06/2021   HCT 32.0 (L) 07/06/2021   MCV 92.0 07/06/2021   PLT 294 07/06/2021    Assessment / Plan: Induction of labor due to GHTN,  progressing well on pitocin  Labor: Progressing normally Preeclampsia:   n/a GHTN: no symptoms.  BPs mostly normal range.  CTM Fetal Wellbeing:  Category I Pain Control:  Labor support without medications I/D:  n/a Anticipated MOD:  NSVD  07/08/2021 07/07/2021, 8:10 AM

## 2021-07-07 NOTE — Anesthesia Procedure Notes (Signed)
Epidural Patient location during procedure: OB Start time: 07/07/2021 2:05 PM End time: 07/07/2021 2:15 PM  Staffing Anesthesiologist: Leonides Grills, MD Performed: anesthesiologist   Preanesthetic Checklist Completed: patient identified, IV checked, site marked, risks and benefits discussed, monitors and equipment checked, pre-op evaluation and timeout performed  Epidural Patient position: sitting Prep: DuraPrep Patient monitoring: heart rate, cardiac monitor, continuous pulse ox and blood pressure Approach: midline Location: L3-L4 Injection technique: LOR air  Needle:  Needle type: Tuohy  Needle gauge: 17 G Needle length: 9 cm Needle insertion depth: 9 cm Catheter type: closed end flexible Catheter size: 19 Gauge Catheter at skin depth: 15 cm Test dose: negative and Other  Assessment Events: blood not aspirated, injection not painful, no injection resistance and negative IV test  Additional Notes Informed consent obtained prior to proceeding. Discussed alternatives to epidural analgesia and patient desires to proceed.  Previous epidural removed prior to the start of procedure. Timeout performed pre-procedure verifying patient name, procedure, and platelet count.  Patient tolerated procedure well. Reason for block:procedure for pain

## 2021-07-07 NOTE — Lactation Note (Signed)
This note was copied from a baby's chart. Lactation Consultation Note  Patient Name: Tanya Schmitt [id number] Date: 07/07/2021 Reason for consult: L&D Initial assessment;1st time breastfeeding;Early term 37-38.6wks Age:22 hours LC entered the room, mom was doing skin to skin with infant when [facility] entered the room. LC observed mom has very flat nipples that are inverted. Mom was given hand pump mom pre-pumped slightly enough to apply 20 mm NS. Mom latched infant on her right breast with 20 mm NS using the football hold position, infant latch swallows observed, colostrum was present in NS when infant came off breast after 10 minutes of feeding. Mom was taught hand expression and infant was given 5 mls of colostrum by spoon and infant started cuing again to feed. Mom re-latch infant on her right breast again and infant was still breastfeeding when [facility] left the room.  Mom knows to breastfeed infant according to feeding cues, 8 to 12+ or more within 24 hours, skin to skin. Maternal Data Has patient been taught Hand Expression?: Yes Does the patient have breastfeeding experience prior to this delivery?: No  Feeding Mother's Current Feeding Choice: Breast Milk  LATCH Score Latch: Grasps breast easily, tongue down, lips flanged, rhythmical sucking. (Infant sustained latch with 20 mm NS)  Audible Swallowing: Spontaneous and intermittent  Type of Nipple: Inverted (Nipples are flat and inverted.)  Comfort (Breast/Nipple): Soft / non-tender  Hold (Positioning): Assistance needed to correctly position infant at breast and maintain latch.  LATCH Score: 7   Lactation Tools Discussed/Used Tools: Pump Breast pump type: Manual Pump Education: Setup, frequency, and cleaning;Milk Storage Reason for Pumping: Mom has flat and inverted nipples that are not stimulated when touched but response to pumping. Pumping frequency: Mom will pre-pump breast prior to latching infant at the  breast.  Interventions Interventions: Breast feeding basics reviewed;Assisted with latch;Support pillows;Adjust position;Breast compression;Hand pump;Expressed milk;Pre-pump if needed;Hand express;Breast massage;Skin to skin  Discharge Pump: Manual WIC Program: No  Consult Status Consult Status: Follow-up from L&D Date: 07/08/21 Follow-up type: In-patient    [name] 07/07/2021, 11:02 PM

## 2021-07-07 NOTE — Anesthesia Preprocedure Evaluation (Signed)
Anesthesia Evaluation  Patient identified by MRN, date of birth, ID band Patient awake    Reviewed: Allergy & Precautions, H&P , NPO status , Patient's Chart, lab work & pertinent test results  History of Anesthesia Complications Negative for: history of anesthetic complications  Airway Mallampati: II  TM Distance: >3 FB Neck ROM: full    Dental no notable dental hx. (+) Teeth Intact   Pulmonary asthma ,    Pulmonary exam normal breath sounds clear to auscultation       Cardiovascular hypertension, Pt. on home beta blockers and Pt. on medications Normal cardiovascular exam Rhythm:regular Rate:Normal     Neuro/Psych negative neurological ROS  negative psych ROS   GI/Hepatic negative GI ROS, Neg liver ROS,   Endo/Other  Morbid obesity  Renal/GU negative Renal ROS  negative genitourinary   Musculoskeletal  (+) Arthritis ,   Abdominal (+) + obese,   Peds  Hematology  (+) Blood dyscrasia, anemia ,   Anesthesia Other Findings   Reproductive/Obstetrics (+) Pregnancy                             Anesthesia Physical Anesthesia Plan  ASA: 3  Anesthesia Plan: Epidural   Post-op Pain Management:    Induction:   PONV Risk Score and Plan:   Airway Management Planned:   Additional Equipment:   Intra-op Plan:   Post-operative Plan:   Informed Consent: I have reviewed the patients History and Physical, chart, labs and discussed the procedure including the risks, benefits and alternatives for the proposed anesthesia with the patient or authorized representative who has indicated his/her understanding and acceptance.       Plan Discussed with:   Anesthesia Plan Comments:         Anesthesia Quick Evaluation

## 2021-07-07 NOTE — Anesthesia Procedure Notes (Signed)
Epidural Patient location during procedure: OB Start time: 07/07/2021 3:13 PM End time: 07/07/2021 3:23 PM  Staffing Anesthesiologist: Leonides Grills, MD Performed: anesthesiologist   Preanesthetic Checklist Completed: patient identified, IV checked, site marked, risks and benefits discussed, monitors and equipment checked, pre-op evaluation and timeout performed  Epidural Patient position: sitting Prep: DuraPrep Patient monitoring: heart rate, cardiac monitor, continuous pulse ox and blood pressure Approach: midline Location: L3-L4 Injection technique: LOR air  Needle:  Needle type: Tuohy  Needle gauge: 18 G Needle length: 9 cm Needle insertion depth: 8 cm Catheter type: closed end flexible Catheter size: 20 Guage Catheter at skin depth: 14 cm Test dose: negative and Other  Assessment Events: blood not aspirated, injection not painful, no injection resistance and negative IV test  Additional Notes Informed consent obtained prior to proceeding including risk of failure, 1% risk of PDPH, risk of minor discomfort and bruising.  Discussed alternatives to epidural analgesia and patient desires to proceed.  Timeout performed pre-procedure verifying patient name, procedure, and platelet count.  Patient tolerated procedure well. Reason for block:procedure for pain

## 2021-07-08 ENCOUNTER — Encounter (HOSPITAL_COMMUNITY): Payer: Self-pay | Admitting: Obstetrics & Gynecology

## 2021-07-08 DIAGNOSIS — Z3490 Encounter for supervision of normal pregnancy, unspecified, unspecified trimester: Secondary | ICD-10-CM

## 2021-07-08 HISTORY — DX: Encounter for supervision of normal pregnancy, unspecified, unspecified trimester: Z34.90

## 2021-07-08 LAB — COMPREHENSIVE METABOLIC PANEL
ALT: 11 U/L (ref 0–44)
AST: 18 U/L (ref 15–41)
Albumin: 2.2 g/dL — ABNORMAL LOW (ref 3.5–5.0)
Alkaline Phosphatase: 113 U/L (ref 38–126)
Anion gap: 9 (ref 5–15)
BUN: <5 mg/dL — ABNORMAL LOW (ref 6–20)
CO2: 20 mmol/L — ABNORMAL LOW (ref 22–32)
Calcium: 8.5 mg/dL — ABNORMAL LOW (ref 8.9–10.3)
Chloride: 109 mmol/L (ref 98–111)
Creatinine, Ser: 0.56 mg/dL (ref 0.44–1.00)
GFR, Estimated: >60 mL/min (ref >60)
Glucose, Bld: 82 mg/dL (ref 70–99)
Potassium: 3.4 mmol/L — ABNORMAL LOW (ref 3.5–5.1)
Sodium: 138 mmol/L (ref 135–145)
Total Bilirubin: 0.5 mg/dL (ref 0.3–1.2)
Total Protein: 5 g/dL — ABNORMAL LOW (ref 6.5–8.1)

## 2021-07-08 LAB — CBC
HCT: 28.5 % — ABNORMAL LOW (ref 36.0–46.0)
Hemoglobin: 9.7 g/dL — ABNORMAL LOW (ref 12.0–15.0)
MCH: 31.3 pg (ref 26.0–34.0)
MCHC: 34 g/dL (ref 30.0–36.0)
MCV: 91.9 fL (ref 80.0–100.0)
Platelets: 245 10*3/uL (ref 150–400)
RBC: 3.1 MIL/uL — ABNORMAL LOW (ref 3.87–5.11)
RDW: 12.7 % (ref 11.5–15.5)
WBC: 14.2 10*3/uL — ABNORMAL HIGH (ref 4.0–10.5)
nRBC: 0 % (ref 0.0–0.2)

## 2021-07-08 MED ORDER — TETANUS-DIPHTH-ACELL PERTUSSIS 5-2.5-18.5 LF-MCG/0.5 IM SUSY: 0.5000 mL | PREFILLED_SYRINGE | Freq: Once | INTRAMUSCULAR | Status: DC

## 2021-07-08 MED ORDER — OXYCODONE-ACETAMINOPHEN 5-325 MG PO TABS: 2.0000 | ORAL_TABLET | ORAL | Status: DC | PRN

## 2021-07-08 MED ORDER — OXYCODONE-ACETAMINOPHEN 5-325 MG PO TABS: 1.0000 | ORAL_TABLET | ORAL | Status: DC | PRN

## 2021-07-08 MED ORDER — ONDANSETRON HCL 4 MG/2ML IJ SOLN: 4.0000 mg | INTRAMUSCULAR | Status: DC | PRN

## 2021-07-08 MED ORDER — COCONUT OIL OIL: 1.0000 "application " | TOPICAL_OIL | Status: DC | PRN

## 2021-07-08 MED ORDER — IBUPROFEN 600 MG PO TABS
600.0000 mg | ORAL_TABLET | Freq: Four times a day (QID) | ORAL | Status: DC
  Administered 2021-07-08 – 2021-07-09 (×7): 600 mg via ORAL
  Filled 2021-07-08 (×7): qty 1

## 2021-07-08 MED ORDER — BENZOCAINE-MENTHOL 20-0.5 % EX AERO
1.0000 "application " | INHALATION_SPRAY | CUTANEOUS | Status: DC | PRN
  Filled 2021-07-08: qty 56

## 2021-07-08 MED ORDER — ONDANSETRON HCL 4 MG PO TABS: 4.0000 mg | ORAL_TABLET | ORAL | Status: DC | PRN

## 2021-07-08 MED ORDER — SENNOSIDES-DOCUSATE SODIUM 8.6-50 MG PO TABS
2.0000 | ORAL_TABLET | Freq: Every day | ORAL | Status: DC
  Administered 2021-07-08: 2 via ORAL
  Filled 2021-07-08: qty 2

## 2021-07-08 MED ORDER — SIMETHICONE 80 MG PO CHEW: 80.0000 mg | CHEWABLE_TABLET | ORAL | Status: DC | PRN

## 2021-07-08 MED ORDER — PRENATAL MULTIVITAMIN CH
1.0000 | ORAL_TABLET | Freq: Every day | ORAL | Status: DC
  Administered 2021-07-08 – 2021-07-09 (×2): 1 via ORAL
  Filled 2021-07-08 (×2): qty 1

## 2021-07-08 MED ORDER — ZOLPIDEM TARTRATE 5 MG PO TABS: 5.0000 mg | ORAL_TABLET | Freq: Every evening | ORAL | Status: DC | PRN

## 2021-07-08 MED ORDER — DIPHENHYDRAMINE HCL 25 MG PO CAPS: 25.0000 mg | ORAL_CAPSULE | Freq: Four times a day (QID) | ORAL | Status: DC | PRN

## 2021-07-08 MED ORDER — ACETAMINOPHEN 325 MG PO TABS: 650.0000 mg | ORAL_TABLET | ORAL | Status: DC | PRN

## 2021-07-08 MED ORDER — DIBUCAINE (PERIANAL) 1 % EX OINT: 1.0000 "application " | TOPICAL_OINTMENT | CUTANEOUS | Status: DC | PRN

## 2021-07-08 MED ORDER — WITCH HAZEL-GLYCERIN EX PADS: 1.0000 "application " | MEDICATED_PAD | CUTANEOUS | Status: DC | PRN

## 2021-07-08 NOTE — Progress Notes (Signed)
PPD # 1  Patient doing well. No  complaints.  BP 113/62 (BP Location: Right Arm)   Pulse 90   Temp 98.1 F (36.7 C) (Oral)   Resp 18   Ht 5\' 3"  (1.6 m)   Wt 116.2 kg   LMP 07/14/2020   SpO2 100%   Breastfeeding Unknown   BMI 45.37 kg/m  Results for orders placed or performed during the hospital encounter of 07/06/21 (from the past 24 hour(s))  Comprehensive metabolic panel     Status: Abnormal   Collection Time: 07/08/21  5:42 AM  Result Value Ref Range   Sodium 138 135 - 145 mmol/L   Potassium 3.4 (L) 3.5 - 5.1 mmol/L   Chloride 109 98 - 111 mmol/L   CO2 20 (L) 22 - 32 mmol/L   Glucose, Bld 82 70 - 99 mg/dL   BUN <5 (L) 6 - 20 mg/dL   Creatinine, Ser 07/10/21 0.44 - 1.00 mg/dL   Calcium 8.5 (L) 8.9 - 10.3 mg/dL   Total Protein 5.0 (L) 6.5 - 8.1 g/dL   Albumin 2.2 (L) 3.5 - 5.0 g/dL   AST 18 15 - 41 U/L   ALT 11 0 - 44 U/L   Alkaline Phosphatase 113 38 - 126 U/L   Total Bilirubin 0.5 0.3 - 1.2 mg/dL   GFR, Estimated 3.01 >31 mL/min   Anion gap 9 5 - 15  CBC     Status: Abnormal   Collection Time: 07/08/21  5:42 AM  Result Value Ref Range   WBC 14.2 (H) 4.0 - 10.5 K/uL   RBC 3.10 (L) 3.87 - 5.11 MIL/uL   Hemoglobin 9.7 (L) 12.0 - 15.0 g/dL   HCT 07/10/21 (L) 88.8 - 75.7 %   MCV 91.9 80.0 - 100.0 fL   MCH 31.3 26.0 - 34.0 pg   MCHC 34.0 30.0 - 36.0 g/dL   RDW 97.2 82.0 - 60.1 %   Platelets 245 150 - 400 K/uL   nRBC 0.0 0.0 - 0.2 %   Abdomen is soft and non tender Lochia WNL  PPD # 1  Doing well Routine care Discharge home tomorrow

## 2021-07-08 NOTE — Lactation Note (Signed)
This note was copied from a baby's chart. Lactation Consultation Note  Patient Name: Tanya Schmitt [id number] Date: 07/08/2021 Reason for consult: Initial assessment;Early term 37-38.6wks;Difficult latch Age:22 hours  Mom states she tried using the nipple shield but it was uncomfortable.  She states she did see milk in the shield when baby fed.    Infant is very sleepy at time of visit.  LC finger fed 87ml of EBM.  Infant did suck but when latched sucked two times then fell asleep.  Mom and dad were shown the teacup hold in order to protrude the tissue at the side of the nipple and bring infant chin first the the breast.  Infant did latch well with wide gape, flanged lip and swallows were heard. Mom denied pain and felt a tug when infant was feeding.  Baby fell asleep after 4 minutes of feeding and was placed STS.    Mom will hand express and try to latch without NS when baby is awake and cueing to feed.  Mom knows how to apply shield if she is unable to latch baby.  Mom is aware DEBP will need to be set up with NS use.    Lactation brochure provided and mom is aware of resources.     Maternal Data Has patient been taught Hand Expression?: Yes  Feeding Mother's Current Feeding Choice: Breast Milk  LATCH Score Latch: Repeated attempts needed to sustain latch, nipple held in mouth throughout feeding, stimulation needed to elicit sucking reflex.  Audible Swallowing: A few with stimulation  Type of Nipple: Flat  Comfort (Breast/Nipple): Soft / non-tender  Hold (Positioning): Assistance needed to correctly position infant at breast and maintain latch.  LATCH Score: 6   Lactation Tools Discussed/Used Breast pump type: Manual Reason for Pumping: Mom has flat nipple tissue that everts wtih hand expression.  Hand pump encouraged to evert tissue. Pumping frequency: pre pump with hand pump prior to latching  Interventions Interventions: Breast feeding basics reviewed;Skin to  skin;Assisted with latch;Breast massage;Position options;Support pillows;Adjust position;Breast compression;Expressed milk  Discharge Pump: Employee Pump (dad will  to get UMR card from car)  Consult Status Consult Status: Follow-up Date: 07/09/21 Follow-up type: In-patient    Maryruth Hancock [facility] 07/08/2021, 1:00 PM

## 2021-07-08 NOTE — Progress Notes (Signed)
CSW received consult for hx of Anxiety, panic attacks and Depression.  CSW met with MOB to offer support and complete assessment, FOB present. CSW introduced self and explained role. CSW asked FOB to leave the room to speak with MOB privately, FOB left the room.   CSW and MOB discussed MOB's mental health history. MOB reported that she was diagnosed with anxiety and depression during middle school. MOB reported that it was situational and attributed the diagnoses to her grandpa passing away. MOB reported that she had panic attacks around that time. MOB denied any current symptoms and reported having some anxiety during her pregnancy surrounding her health and pregnancy. MOB denied any additional mental health history. MOB reported that she is not taking any medication nor participating in therapy to treat mental health diagnoses. CSW inquired about how MOB was feeling emotionally after giving birth, MOB reported that she was feeling fine. CSW inquired about MOB's support system, MOB reported that FOB and her mom are supports. MOB reported that they have all items needed to care for infant including a car seat, bedside basinet, and crib. MOB presented calm and did not demonstrate any acute mental health signs/symptoms. CSW assessed for safety, MOB denied SI, HI, and domestic violence. CSW informed MOB that she may be more susceptible to postpartum depression due to her mental health history, MOB verbalized understanding and shared that her mother experienced PPD.   CSW provided education regarding the baby blues period vs. perinatal mood disorders, discussed treatment and gave resources for mental health follow up if concerns arise.  CSW recommends self-evaluation during the postpartum time period using the New Mom Checklist from Postpartum Progress and encouraged MOB to contact a medical professional if symptoms are noted at any time.    CSW provided review of Sudden Infant Death Syndrome (SIDS) precautions.     CSW identifies no further need for intervention and no barriers to discharge at this time.  Tanya Christian, LCSW Clinical Social Worker [facility] Cell#: [phone]  

## 2021-07-08 NOTE — Anesthesia Postprocedure Evaluation (Signed)
Anesthesia Post Note  Patient: Tanya Schmitt  Procedure(s) Performed: AN AD HOC LABOR EPIDURAL     Patient location during evaluation: Mother Baby Anesthesia Type: Epidural Level of consciousness: awake and alert Pain management: pain level controlled Vital Signs Assessment: post-procedure vital signs reviewed and stable Respiratory status: spontaneous breathing, nonlabored ventilation and respiratory function stable Cardiovascular status: stable Postop Assessment: no headache, no backache, epidural receding, no apparent nausea or vomiting, patient able to bend at knees, adequate PO intake and able to ambulate Anesthetic complications: no   No notable events documented.  Last Vitals:  Vitals:   07/07/21 2351 07/08/21 0529  BP: 117/65 113/62  Pulse: (!) 119 90  Resp: 18 18  Temp: 36.9 C 36.7 C  SpO2: 100% 100%    Last Pain:  Vitals:   07/08/21 0529  TempSrc: Oral  PainSc: 0-No pain   Pain Goal:                   [place]

## 2021-07-08 NOTE — Lactation Note (Signed)
This note was copied from a baby's chart. Lactation Consultation Note  Patient Name: Tanya Schmitt [id number] Date: 07/08/2021   Age:22 hours   LC Follow Up Note:  RN requested [employer] employee pump for mother.  Delivered the [facility]; all paperwork filled out and placed in Tanya Schmitt mailbox.  Receipt given to mom and insurance card returned.   Maternal Data    Feeding    LATCH Score                    Lactation Tools Discussed/Used    Interventions    Discharge    Consult Status      Tanya Schmitt R Colbin Jovel 07/08/2021, 7:10 PM

## 2021-07-09 MED ORDER — IBUPROFEN 600 MG PO TABS: 600.0000 mg | ORAL_TABLET | Freq: Four times a day (QID) | ORAL | 0 refills | Status: DC

## 2021-07-09 NOTE — Discharge Summary (Signed)
Postpartum Discharge Summary      Patient Name: Tanya Schmitt DOB: Oct 23, 2000 MRN: [id number]  Date of admission: 07/06/2021 Delivery date:07/07/2021  Delivering provider: Linda Hedges  Date of discharge: 07/09/2021  Admitting diagnosis: Gestational hypertension [O13.9] Pregnancy [Z34.90] Intrauterine pregnancy: [redacted]w[redacted]d     Secondary diagnosis:  Active Problems:   Gestational hypertension   Pregnancy  Additional problems:      Discharge diagnosis: Term Pregnancy Delivered                                              Post partum procedures:   Augmentation: AROM, Pitocin, Cytotec, and IP Foley Complications: None  Hospital course: Induction of Labor With Vaginal Delivery   20 y.o. yo G1P1001 at [redacted]w[redacted]d was admitted to the hospital 07/06/2021 for induction of labor.  Indication for induction: Gestational hypertension.  Patient had an uncomplicated labor course as follows: Membrane Rupture Time/Date: 2:16 PM ,07/07/2021   Delivery Method:Vaginal, Spontaneous  Episiotomy: None  Lacerations:  1st degree;Periurethral;Vaginal  Details of delivery can be found in separate delivery note.  Patient had a routine postpartum course. Patient is discharged home 07/09/21.  Newborn Data: Birth date:07/07/2021  Birth time:9:19 PM  Gender:Female  Living status:Living  Apgars:8 ,9  Weight:3249 g   Magnesium Sulfate received: No BMZ received: No Rhophylac:N/A MMR:N/A T-DaP:Given prenatally Flu: N/A Transfusion:No  Physical exam  Vitals:   07/08/21 0529 07/08/21 1203 07/08/21 2030 07/09/21 0545  BP: 113/62 (!) 126/92 125/79 120/78  Pulse: 90 93 (!) 102 97  Resp: $Remo'18  18 17  'aCjMK$ Temp: 98.1 F (36.7 C) 97.9 F (36.6 C) 97.9 F (36.6 C) 98 F (36.7 C)  TempSrc: Oral Oral Oral Oral  SpO2: 100%  96% 97%  Weight:      Height:       General: alert, cooperative, and no distress Lochia: appropriate Uterine Fundus: firm Incision: Healing well with no significant drainage DVT Evaluation:  No evidence of DVT seen on physical exam. Labs: Lab Results  Component Value Date   WBC 14.2 (H) 07/08/2021   HGB 9.7 (L) 07/08/2021   HCT 28.5 (L) 07/08/2021   MCV 91.9 07/08/2021   PLT 245 07/08/2021   CMP Latest Ref Rng & Units 07/08/2021  Glucose 70 - 99 mg/dL 82  BUN 6 - 20 mg/dL <5(L)  Creatinine 0.44 - 1.00 mg/dL 0.56  Sodium 135 - 145 mmol/L 138  Potassium 3.5 - 5.1 mmol/L 3.4(L)  Chloride 98 - 111 mmol/L 109  CO2 22 - 32 mmol/L 20(L)  Calcium 8.9 - 10.3 mg/dL 8.5(L)  Total Protein 6.5 - 8.1 g/dL 5.0(L)  Total Bilirubin 0.3 - 1.2 mg/dL 0.5  Alkaline Phos 38 - 126 U/L 113  AST 15 - 41 U/L 18  ALT 0 - 44 U/L 11   Edinburgh Score: No flowsheet data found.    After visit meds:  Allergies as of 07/09/2021       Reactions   Keflex [cephalexin] Hives   Penicillins Hives   Sulfa Antibiotics Hives        Medication List     STOP taking these medications    aspirin 81 MG EC tablet   Doxylamine-Pyridoxine 10-10 MG Tbec   famotidine 20 MG tablet Commonly known as: PEPCID       TAKE these medications    cyclobenzaprine 10 MG tablet Commonly  known as: FLEXERIL cyclobenzaprine 10 mg tablet  TAKE 1 TABLET BY MOUTH AT BEDTIME AS NEEDED FOR SPASM/ PAIN   ibuprofen 600 MG tablet Commonly known as: ADVIL Take 1 tablet (600 mg total) by mouth every 6 (six) hours.   labetalol 100 MG tablet Commonly known as: NORMODYNE Take 100 mg by mouth 2 (two) times daily.   PRENATAL VITAMINS PO M-Natal Plus 27 mg iron-1 mg tablet   simethicone 80 MG chewable tablet Commonly known as: Gas-X Chew 1 tablet (80 mg total) by mouth every 6 (six) hours as needed for flatulence.         Discharge home in stable condition Infant Feeding: Breast Infant Disposition:home with mother Discharge instruction: per After Visit Summary and Postpartum booklet. Activity: Advance as tolerated. Pelvic rest for 6 weeks.  Diet: routine diet Anticipated Birth Control:  Unsure Postpartum Appointment:6 weeks Additional Postpartum F/U: BP check 1 week Future Appointments:No future appointments. Follow up Visit:      07/09/2021 Luz Lex, MD

## 2021-07-09 NOTE — Lactation Note (Signed)
This note was copied from a baby's chart. Lactation Consultation Note  Patient Name: Tanya Schmitt CLEXN'T Date: 07/09/2021 Reason for consult: Follow-up assessment;Mother's request;Difficult latch;Primapara;1st time breastfeeding;Early term 37-38.6wks Age:22 hours  Mom increased flange size 24 to 27. Mom pumping volume 2--15ml. Mom aware to give colostrum first and now transition to formula for supplementation.   Plan 1. To feed based on cues 8-12x 24hr period. Mom asked about extension of one feeding infant would not wake. We talked about not letting infant go more than 4 hrs without a feeding. Mom to offer Ebm on spoon to help wake her up then latch. 2. Mom to supplement with EBM first followed by formula with slow flow nipple and pace bottle feeding. Mom aware if infant not latching to supplement with 15-30 ml or more 3. Mom to use DEBP q3hrs for 15 min  4 LC brochure reviewed   Maternal Data Has patient been taught Hand Expression?: Yes  Feeding Mother's Current Feeding Choice: Breast Milk and Formula  LATCH Score                    Lactation Tools Discussed/Used Tools: Pump;Flanges;Coconut oil;Nipple Shields Nipple shield size: 20 Flange Size: 27 Breast pump type: Double-Electric Breast Pump Pump Education: Setup, frequency, and cleaning;Milk Storage Reason for Pumping: increase stimulation Pumping frequency: every 3 hrs for  Interventions Interventions: Breast feeding basics reviewed;Breast compression;Assisted with latch;Adjust position;Skin to skin;Support pillows;DEBP;Breast massage;Position options;Hand express;Expressed milk;Education;Coconut oil;Pace feeding  Discharge Discharge Education: Engorgement and breast care;Warning signs for feeding baby Pump: Personal  Consult Status Consult Status: Complete Date: 07/09/21    Tanya Schmitt  Tanya Schmitt 07/09/2021, 12:07 PM

## 2021-07-16 ENCOUNTER — Other Ambulatory Visit: Payer: Self-pay

## 2021-07-16 DIAGNOSIS — M1990 Unspecified osteoarthritis, unspecified site: Secondary | ICD-10-CM | POA: Insufficient documentation

## 2021-07-16 DIAGNOSIS — F419 Anxiety disorder, unspecified: Secondary | ICD-10-CM

## 2021-07-16 DIAGNOSIS — J45909 Unspecified asthma, uncomplicated: Secondary | ICD-10-CM | POA: Insufficient documentation

## 2021-07-16 DIAGNOSIS — M4306 Spondylolysis, lumbar region: Secondary | ICD-10-CM | POA: Insufficient documentation

## 2021-07-16 DIAGNOSIS — K219 Gastro-esophageal reflux disease without esophagitis: Secondary | ICD-10-CM

## 2021-07-16 DIAGNOSIS — O139 Gestational [pregnancy-induced] hypertension without significant proteinuria, unspecified trimester: Secondary | ICD-10-CM | POA: Insufficient documentation

## 2021-07-16 DIAGNOSIS — G43909 Migraine, unspecified, not intractable, without status migrainosus: Secondary | ICD-10-CM

## 2021-07-16 DIAGNOSIS — M47817 Spondylosis without myelopathy or radiculopathy, lumbosacral region: Secondary | ICD-10-CM

## 2021-07-16 HISTORY — DX: Gastro-esophageal reflux disease without esophagitis: K21.9

## 2021-07-16 HISTORY — DX: Migraine, unspecified, not intractable, without status migrainosus: G43.909

## 2021-07-16 HISTORY — DX: Spondylosis without myelopathy or radiculopathy, lumbosacral region: M47.817

## 2021-07-16 HISTORY — DX: Anxiety disorder, unspecified: F41.9

## 2021-07-19 ENCOUNTER — Telehealth (HOSPITAL_COMMUNITY): Payer: Self-pay | Admitting: *Deleted

## 2021-07-19 NOTE — Telephone Encounter (Signed)
Attempted hospital discharge follow-up call. Left message for patient to return RN call. Deforest Hoyles, RN, 07/19/21, [id number].

## 2021-08-26 ENCOUNTER — Encounter: Payer: Self-pay | Admitting: Cardiology

## 2021-08-26 ENCOUNTER — Other Ambulatory Visit: Payer: Self-pay

## 2021-08-26 ENCOUNTER — Ambulatory Visit (INDEPENDENT_AMBULATORY_CARE_PROVIDER_SITE_OTHER): Payer: No Typology Code available for payment source

## 2021-08-26 ENCOUNTER — Ambulatory Visit (INDEPENDENT_AMBULATORY_CARE_PROVIDER_SITE_OTHER): Payer: No Typology Code available for payment source | Admitting: Cardiology

## 2021-08-26 VITALS — BP 116/78 | HR 90 | Ht 63.0 in | Wt 230.0 lb

## 2021-08-26 DIAGNOSIS — R011 Cardiac murmur, unspecified: Secondary | ICD-10-CM | POA: Insufficient documentation

## 2021-08-26 DIAGNOSIS — R002 Palpitations: Secondary | ICD-10-CM

## 2021-08-26 DIAGNOSIS — O133 Gestational [pregnancy-induced] hypertension without significant proteinuria, third trimester: Secondary | ICD-10-CM

## 2021-08-26 HISTORY — DX: Morbid (severe) obesity due to excess calories: E66.01

## 2021-08-26 HISTORY — DX: Palpitations: R00.2

## 2021-08-26 HISTORY — DX: Cardiac murmur, unspecified: R01.1

## 2021-08-26 NOTE — Patient Instructions (Signed)
Medication Instructions:  Your physician recommends that you continue on your current medications as directed. Please refer to the Current Medication list given to you today.   *If you need a refill on your cardiac medications before your next appointment, please call your pharmacy*   Lab Work: None ordered If you have labs (blood work) drawn today and your tests are completely normal, you will receive your results only by: MyChart Message (if you have MyChart) OR A paper copy in the mail If you have any lab test that is abnormal or we need to change your treatment, we will call you to review the results.   Testing/Procedures: Your physician has requested that you have an echocardiogram. Echocardiography is a painless test that uses sound waves to create images of your heart. It provides your doctor with information about the size and shape of your heart and how well your heart's chambers and valves are working. This procedure takes approximately one hour. There are no restrictions for this procedure.   WHY IS MY DOCTOR PRESCRIBING ZIO? The Zio system is proven and trusted by physicians to detect and diagnose irregular heart rhythms -- and has been prescribed to hundreds of thousands of patients.  The FDA has cleared the Zio system to monitor for many different kinds of irregular heart rhythms. In a study, physicians were able to reach a diagnosis 90% of the time with the Zio system1.  You can wear the Zio monitor -- a small, discreet, comfortable patch -- during your normal day-to-day activity, including while you sleep, shower, and exercise, while it records every single heartbeat for analysis.  1Barrett, P., et al. Comparison of 24 Hour Holter Monitoring Versus 14 Day Novel Adhesive Patch Electrocardiographic Monitoring. American Journal of Medicine, 2014.  ZIO VS. HOLTER MONITORING The Zio monitor can be comfortably worn for up to 14 days. Holter monitors can be worn for 24 to 48  hours, limiting the time to record any irregular heart rhythms you may have. Zio is able to capture data for the 51% of patients who have their first symptom-triggered arrhythmia after 48 hours.1  LIVE WITHOUT RESTRICTIONS The Zio ambulatory cardiac monitor is a small, unobtrusive, and water-resistant patch--you might even forget you're wearing it. The Zio monitor records and stores every beat of your heart, whether you're sleeping, working out, or showering. Wear the monitor 14 days, remove 09/09/21.  Follow-Up: At [facility], you and your health needs are our priority.  As part of our continuing mission to provide you with exceptional heart care, we have created designated Provider Care Teams.  These Care Teams include your primary Cardiologist (physician) and Advanced Practice Providers (APPs -  Physician Assistants and Nurse Practitioners) who all work together to provide you with the care you need, when you need it.  We recommend signing up for the patient portal called "MyChart".  Sign up information is provided on this After Visit Summary.  MyChart is used to connect with patients for Virtual Visits (Telemedicine).  Patients are able to view lab/test results, encounter notes, upcoming appointments, etc.  Non-urgent messages can be sent to your provider as well.   To learn more about what you can do with MyChart, go to [email or web address].    Your next appointment:   6 month(s)  The format for your next appointment:   In Person  Provider:   Belva Crome, MD   Other Instructions Echocardiogram An echocardiogram is a test that uses sound waves (ultrasound) to produce images of  the heart. Images from an echocardiogram can provide important information about: Heart size and shape. The size and thickness and movement of your heart's walls. Heart muscle function and strength. Heart valve function or if you have stenosis. Stenosis is when the heart valves are too narrow. If  blood is flowing backward through the heart valves (regurgitation). A tumor or infectious growth around the heart valves. Areas of heart muscle that are not working well because of poor blood flow or injury from a heart attack. Aneurysm detection. An aneurysm is a weak or damaged part of an artery wall. The wall bulges out from the normal force of blood pumping through the body. Tell a health care provider about: Any allergies you have. All medicines you are taking, including vitamins, herbs, eye drops, creams, and over-the-counter medicines. Any blood disorders you have. Any surgeries you have had. Any medical conditions you have. Whether you are pregnant or may be pregnant. What are the risks? Generally, this is a safe test. However, problems may occur, including an allergic reaction to dye (contrast) that may be used during the test. What happens before the test? No specific preparation is needed. You may eat and drink normally. What happens during the test? You will take off your clothes from the waist up and put on a hospital gown. Electrodes or electrocardiogram (ECG)patches may be placed on your chest. The electrodes or patches are then connected to a device that monitors your heart rate and rhythm. You will lie down on a table for an ultrasound exam. A gel will be applied to your chest to help sound waves pass through your skin. A handheld device, called a transducer, will be pressed against your chest and moved over your heart. The transducer produces sound waves that travel to your heart and bounce back (or "echo" back) to the transducer. These sound waves will be captured in real-time and changed into images of your heart that can be viewed on a video monitor. The images will be recorded on a computer and reviewed by your health care provider. You may be asked to change positions or hold your breath for a short time. This makes it easier to get different views or better views of your  heart. In some cases, you may receive contrast through an IV in one of your veins. This can improve the quality of the pictures from your heart. The procedure may vary among health care providers and hospitals.   What can I expect after the test? You may return to your normal, everyday life, including diet, activities, and medicines, unless your health care provider tells you not to do that. Follow these instructions at home: It is up to you to get the results of your test. Ask your health care provider, or the department that is doing the test, when your results will be ready. Keep all follow-up visits. This is important. Summary An echocardiogram is a test that uses sound waves (ultrasound) to produce images of the heart. Images from an echocardiogram can provide important information about the size and shape of your heart, heart muscle function, heart valve function, and other possible heart problems. You do not need to do anything to prepare before this test. You may eat and drink normally. After the echocardiogram is completed, you may return to your normal, everyday life, unless your health care provider tells you not to do that. This information is not intended to replace advice given to you by your health care provider. Make  sure you discuss any questions you have with your health care provider. Document Revised: 05/26/2020 Document Reviewed: 05/26/2020 Elsevier Patient Education  2021 [employer].

## 2021-08-26 NOTE — Progress Notes (Signed)
Cardiology Office Note:    Date:  08/26/2021   ID:  [name], DOB [birth year], MRN [id number]  PCP:  Lise Auer, MD  Cardiologist:  Garwin Brothers, MD   Referring MD: Vertell Novak*    ASSESSMENT:    1. Pregnancy-induced hypertension in third trimester   2. Palpitations   3. Morbid obesity (HCC)   4. Cardiac murmur    PLAN:    In order of problems listed above:  Primary prevention stressed with the patient.  Importance of compliance with diet medication stressed and she vocalized understanding. Palpitations: I will get blood work report from primary care including TSH.  She does not have that report at this point.  We will also do a 2-week monitoring to assess her symptoms and her heart rate at baseline.  She was initiated on beta-blocker at one point but she started having orthostatic hypotension issues so I do not think that is a good idea at this point. Cardiac murmur: Echocardiogram will be done to assess murmur heard on auscultation. Obesity: Weight reduction was stressed.  Diet was emphasized and she promises to do better.  Importance of regular exercise stressed.Patient will be seen in follow-up appointment in 6 months or earlier if the patient has any concerns    Medication Adjustments/Labs and Tests Ordered: Current medicines are reviewed at length with the patient today.  Concerns regarding medicines are outlined above.  No orders of the defined types were placed in this encounter.  No orders of the defined types were placed in this encounter.    History of Present Illness:    Tanya Schmitt is a 20 y.o. female who is being seen today for the evaluation of palpitations at the request of Maurie Boettcher T, F*.  Patient is a pleasant 20 year old female.  She has no significant past medical history.  She was diagnosed with gestational hypertension but that has resolved since delivery.  She denies any chest pain orthopnea or PND.  She takes care  of activities of daily living.  She gives history of some palpitations and her heart rate being elevated at times.  Also noticed that she is anemic and her hemoglobin has dropped in the past several months most likely secondary to delivery and pregnancy induced hemodilution.  At the time of my evaluation she is alert awake oriented and in no distress.  She is seen accompanied by her mother.  At the time of my evaluation, the patient is alert awake oriented and in no distress.  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     Allergies:   Cefdinir, Keflex [cephalexin], Penicillins, and Sulfa antibiotics   Social History   Socioeconomic History   Marital status: Single    Spouse name: Not on file   Number of children: Not on file  Years of education: Not on file   Highest education level: Not on file  Occupational History   Occupation: [employer]    Employer: [employer]  Tobacco Use   Smoking status: Never   Smokeless tobacco: Never  Vaping Use   Vaping Use: Never used  Substance and Sexual Activity   Alcohol use: Never   Drug use: Never   Sexual activity: Yes    Birth control/protection: None  Other Topics Concern   Not on file  Social History Narrative   Not on file   Social Determinants of Health   Financial Resource Strain: Not  on file  Food Insecurity: Not on file  Transportation Needs: Not on file  Physical Activity: Not on file  Stress: Not on file  Social Connections: Not on file     Family History: The patient's family history includes ADD / ADHD in her brother; Alcohol abuse in her brother and father; Anxiety disorder in her maternal aunt, mother, paternal aunt, paternal grandmother, paternal uncle, and sister; Arthritis in her sister; Asthma in her sister; Cancer in her paternal grandmother; Depression in her maternal grandmother and mother; Diabetes in her maternal grandmother; Drug abuse in her brother and father; Heart attack in her mother; Heart disease in her maternal grandfather and mother; Hypertension in her maternal grandfather, maternal uncle, and mother; Miscarriages / Stillbirths in her mother and sister; Stroke in her mother.  ROS:   Please see the history of present illness.    All other systems reviewed and are negative.  EKGs/Labs/Other Studies Reviewed:    The following studies were reviewed today: EKG reveals sinus rhythm and nonspecific ST-T changes   Recent Labs: 07/08/2021: ALT 11; BUN <5; Creatinine, Ser 0.56; Hemoglobin 9.7; Platelets 245; Potassium 3.4; Sodium 138  Recent Lipid Panel No results found for: CHOL, TRIG, HDL, CHOLHDL, VLDL, LDLCALC, LDLDIRECT  Physical Exam:    VS:  BP 116/78   Pulse 90   Ht 5\' 3"  (1.6 m)   Wt 230 lb (104.3 kg)   LMP 07/14/2020   SpO2 98%   BMI 40.74 kg/m     Wt Readings from Last 3 Encounters:  08/26/21 230 lb (104.3 kg)  07/06/21 256 lb 1.6 oz (116.2 kg)  06/25/21 256 lb (116.1 kg)     GEN: Patient is in no acute distress HEENT: Normal NECK: No JVD; No carotid bruits LYMPHATICS: No lymphadenopathy CARDIAC: S1 S2 regular, 2/6 systolic murmur at the apex. RESPIRATORY:  Clear to auscultation without rales, wheezing or rhonchi  ABDOMEN: Soft, non-tender, non-distended MUSCULOSKELETAL:  No edema; No deformity  SKIN: Warm and  dry NEUROLOGIC:  Alert and oriented x 3 PSYCHIATRIC:  Normal affect    Signed, 08/25/21, MD  08/26/2021 2:14 PM    [facility]

## 2021-09-06 ENCOUNTER — Other Ambulatory Visit: Payer: Self-pay

## 2021-09-06 ENCOUNTER — Ambulatory Visit (INDEPENDENT_AMBULATORY_CARE_PROVIDER_SITE_OTHER): Payer: No Typology Code available for payment source

## 2021-09-06 DIAGNOSIS — R011 Cardiac murmur, unspecified: Secondary | ICD-10-CM

## 2021-09-06 LAB — ECHOCARDIOGRAM COMPLETE
Area-P 1/2: 3.76 cm2
S' Lateral: 2.6 cm

## 2022-02-23 ENCOUNTER — Encounter: Payer: Self-pay | Admitting: Cardiology

## 2022-02-23 ENCOUNTER — Ambulatory Visit (INDEPENDENT_AMBULATORY_CARE_PROVIDER_SITE_OTHER): Payer: BC Managed Care – PPO | Admitting: Cardiology

## 2022-02-23 DIAGNOSIS — I951 Orthostatic hypotension: Secondary | ICD-10-CM

## 2022-02-23 HISTORY — DX: Orthostatic hypotension: I95.1

## 2022-02-23 NOTE — Patient Instructions (Signed)

## 2022-02-23 NOTE — Progress Notes (Signed)
?Cardiology Office Note:   ? ?Date:  02/23/2022  ? ?ID:  Tanya Schmitt, DOB January 04, 2001, MRN [id number] ? ?PCP:  Lise Auer, MD  ?Cardiologist:  Garwin Brothers, MD  ? ?Referring MD: Lise Auer, MD  ? ? ?ASSESSMENT:   ? ?1. Morbid obesity (HCC)   ?2. Orthostatic hypotension   ? ?PLAN:   ? ?In order of problems listed above: ? ?Patient seems to have symptoms of orthostatic hypotension.  I discussed this with the patient at extensive length and questions were answered to her satisfaction.  I told her to take extra salt and water in the diet and use compression stockings.  Fall precautions was advised and she vocalized understanding.  I reassured her about my findings today.  I was not able to elicit orthostatic hypotension on my measurements but her symptoms are very atypical.  She will get back to [place] and let us know how she feels in the next 2 to 3 weeks.Patient will be seen in follow-up appointment in 12 months or earlier if the patient has any concerns.  It is to be noted that she has never had a syncope. ? ? ? ?Medication Adjustments/Labs and Tests Ordered: ?Current medicines are reviewed at length with the patient today.  Concerns regarding medicines are outlined above.  ?No orders of the defined types were placed in this encounter. ? ?No orders of the defined types were placed in this encounter. ? ? ? ?Chief Complaint  ?Patient presents with  ? Follow-up  ?  ? ?History of Present Illness:   ? ?Tanya Schmitt is a 20 y.o. female.  Patient has past medical history elevated blood pressure especially during gestation.  She is morbidly obese and leads a sedentary lifestyle.  She now mentions to me that she feels lightheaded especially when she tries to change posture from lying down to sitting up.  No syncope. ? ?Past Medical History:  ?Diagnosis Date  ? Anxiety disorder 07/16/2021  ? Arthritis   ? Asthma   ? Cardiac murmur 08/26/2021  ? Chronic bilateral low back pain without sciatica 12/29/2015  ?  Gastroesophageal reflux disease 07/16/2021  ? Gestational hypertension 07/06/2021  ? 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 Migraine 07/16/2021  ? Morbid obesity (HCC) 08/26/2021  ? Palpitations 08/26/2021  ? Pars defect of lumbar spine   ? Pregnancy 07/08/2021  ? Pregnancy induced hypertension   ? 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 ? ?Past Surgical History:  ?Procedure Laterality Date  ? NO PAST SURGERIES    ? ? ?Current Medications: ?No outpatient medications have been marked as taking for the 02/23/22 encounter (Office Visit) with Adisa Litt, Aundra Dubin, MD.  ?  ? ?Allergies:   Cefdinir, Keflex [cephalexin], Penicillins, and Sulfa antibiotics  ? ?Social History  ? ?Socioeconomic History  ? Marital status: Single  ?  Spouse name: Not on file  ? Number of children: Not on file  ? Years of education: Not on file  ? Highest education level: Not on file  ?Occupational History  ? 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  Employer: [employer]  ?Tobacco Use  ? Smoking status: Never  ? Smokeless tobacco: Never  ?Vaping Use  ? Vaping Use: Never used  ?Substance and Sexual Activity  ? Alcohol use: Never  ? Drug use: Never  ? Sexual activity: Yes  ?  Birth control/protection: None  ?Other Topics Concern  ? Not on file  ?Social History Narrative  ? Not on  file  ? ?Social Determinants of Health  ? ?Financial Resource Strain: Not on file  ?Food Insecurity: Not on file  ?Transportation Needs: Not on file  ?Physical Activity: Not on file  ?Stress: Not on file  ?Social Connections: Not on file  ?  ? ?Family History: ?The patient's family history includes ADD / ADHD in her brother; Alcohol abuse in her brother and father; Anxiety disorder in her maternal aunt, mother, paternal aunt, paternal grandmother, paternal uncle, and sister; Arthritis in her sister; Asthma in her sister; Cancer in her paternal grandmother; Depression in her maternal grandmother and mother; Diabetes in her maternal grandmother; Drug abuse in her brother  and father; Heart attack in her mother; Heart disease in her maternal grandfather and mother; Hypertension in her maternal grandfather, maternal uncle, and mother; Miscarriages / Stillbirths in her mother and sister; Stroke in her mother. ? ?ROS:   ?Please see the history of present illness.    ?All other systems reviewed and are negative. ? ?EKGs/Labs/Other Studies Reviewed:   ? ?The following studies were reviewed today: ?EKG reveals sinus rhythm and nonspecific ST-T changes ? ? ?Recent Labs: ?07/08/2021: ALT 11; BUN <5; Creatinine, Ser 0.56; Hemoglobin 9.7; Platelets 245; Potassium 3.4; Sodium 138  ?Recent Lipid Panel ?No results found for: CHOL, TRIG, HDL, CHOLHDL, VLDL, LDLCALC, LDLDIRECT ? ?Physical Exam:   ? ?VS:  BP (P) 120/70 (BP Location: Left Arm, Patient Position: Sitting, Cuff Size: Large)   Pulse (P) 90   Ht 5\' 3"  (1.6 m)   Wt 231 lb (104.8 kg)   SpO2 (P) 99%   BMI 40.92 kg/m?    ? ?Wt Readings from Last 3 Encounters:  ?02/23/22 231 lb (104.8 kg)  ?08/26/21 230 lb (104.3 kg)  ?07/06/21 256 lb 1.6 oz (116.2 kg)  ?  ? ?GEN: Patient is in no acute distress ?HEENT: Normal ?NECK: No JVD; No carotid bruits ?LYMPHATICS: No lymphadenopathy ?CARDIAC: Hear sounds regular, 2/6 systolic murmur at the apex. ?RESPIRATORY:  Clear to auscultation without rales, wheezing or rhonchi  ?ABDOMEN: Soft, non-tender, non-distended ?MUSCULOSKELETAL:  No edema; No deformity  ?SKIN: Warm and dry ?NEUROLOGIC:  Alert and oriented x 3 ?PSYCHIATRIC:  Normal affect  ? ?Signed, ?07/08/21, MD  ?02/23/2022 4:15 PM    ?[facility]  ?

## 2023-01-24 ENCOUNTER — Encounter: Payer: Self-pay | Admitting: Sports Medicine

## 2023-01-24 ENCOUNTER — Ambulatory Visit: Payer: BC Managed Care – PPO | Admitting: Sports Medicine

## 2023-01-24 ENCOUNTER — Other Ambulatory Visit: Payer: Self-pay

## 2023-01-24 DIAGNOSIS — S8001XA Contusion of right knee, initial encounter: Secondary | ICD-10-CM

## 2023-01-24 DIAGNOSIS — M25562 Pain in left knee: Secondary | ICD-10-CM | POA: Diagnosis not present

## 2023-01-24 DIAGNOSIS — W19XXXA Unspecified fall, initial encounter: Secondary | ICD-10-CM | POA: Diagnosis not present

## 2023-01-24 NOTE — Progress Notes (Signed)
Tanya Schmitt - 22 y.o. female MRN [id number]  Date of birth: 20-Aug-2001  Office Visit Note: Visit Date: 01/24/2023 PCP: Lise Auer, MD Referred by: Lise Auer, MD  Subjective: Chief Complaint  Patient presents with   Right Knee - Pain   Left Knee - Pain   HPI: Tanya Schmitt is a pleasant 22 y.o. female who presents today for bilateral knee pain s/p mechanical fall on 01/23/23.  Katianne had a mechanical fall when she was holding.  Baby yesterday.  She fell directly onto her knees onto the right knee and left knee with the left knee following a slightly valgus fashion.  She was protecting her baby which she did successfully so she also hit her elbows.  She was seen at [facility] and did have x-ray imaging of bilateral elbows, knees and her hips.  Review review showed no acute fracture or bony abnormality. She has been quite sore since the fall, her pain yesterday was an 8/10, her pain today is 6/10.  She has iced, but is not taking any medication for her pain.  The elbows and hips are doing better and these are more her issue, the left is greater than the right.  She does have bruising over both knees.  Pertinent ROS were reviewed with the patient and found to be negative unless otherwise specified above in HPI.   Assessment & Plan: Visit Diagnoses:  1. Acute pain of left knee   2. Contusion of right knee, initial encounter   3. Fall, initial encounter    Plan: Discussed with family today the nature of her injuries from her fall.  Did review her previous x-ray imaging which shows no acute fracture.  Her right knee she has soft tissue bruising and a knee contusion from her fall, I do think this is most likely from the left knee as well given her ecchymosis.  Her exam could suggest a possible underlying meniscal injury, although suspicion for this is slightly lower.  Discussed with her the only way to be sure what this would be obtaining an MRI of the knee, however I think it is  best for her to wait 2 weeks for her to ice, take over-the-counter anti-inflammatories and a neoprene compression sleeve as needed.  Printed out several neoprene compression sleeve she may purchase over-the-counter.  Would expect this to get 90+% better after 2 weeks if this is simply a contusion. She will message me in 2 weeks if she does not significantly improve, we may consider obtaining an MRI to rule out meniscal or other internal pathology. Otherwise, f/u PRN.  Follow-up: Return if symptoms worsen or fail to improve, for will call/message in 2 weeks if not 90%+ improved.   Meds & Orders: No orders of the defined types were placed in this encounter.   Orders Placed This Encounter  Procedures   [place] Extrem Low Left Ltd     Procedures: No procedures performed      Clinical History: No specialty comments available.  She reports that she has never smoked. She has never used smokeless tobacco. No results for input(s): "HGBA1C", "LABURIC" in the last 8760 hours.  Objective:    Physical Exam  Gen: Well-appearing, in no acute distress; non-toxic CV: Regular Rate. Well-perfused. Warm.  Resp: Breathing unlabored on room air; no wheezing. Psych: Fluid speech in conversation; appropriate affect; normal thought process Neuro: Sensation intact throughout. No gross coordination deficits.   Ortho Exam - Bilateral knees: Evaluation of  bilateral knees shows no significant effusion or redness.  There is ecchymosis noted over the anterior medial aspect of bilateral knees.  Range of motion from 0-135 degrees bilaterally, there is pain with endrange flexion of the left knee.  There is pain over the medial joint line on the left, although difficult to ascertain whether this is the joint line or the surrounding soft tissue with associated bruising.  Negative McMurray's, equivocal Thessaly's on the left with pain with bending and twisting.  Straight leg raise intact bilaterally.  Strength 5/5  throughout.  Imaging: US Extrem Low Left Ltd  Result Date: 01/24/2023 Limited musculoskeletal ultrasound of the right and left knee was performed today.  Right knee patella was evaluated without cortical regularity.  Patellar tendon was seen with proper insertion and origination without evidence of tearing or hyperemia.  Suprapatellar pouch was identified without notable knee effusion.  No cortical irregularity or hyperemia noted over the tibial tubercle.  The left knee was evaluated with short axis and long axis of the patella without cortical defect.  There is an intact patellar tendon with appropriate origin and insertion.  The suprapatellar pouch shows no significant effusion, however over the medial side there is some mild hypoechoic fluid possibly suggestive of trace effusion versus soft tissue swelling.  The lateral joint line was evaluated without effusion, limited view of meniscus appears intact without tearing.   EXAM: RIGHT KNEE - COMPLETE 4+ VIEW  COMPARISON: None Available.  FINDINGS: No evidence of fracture, dislocation, or joint effusion. No evidence of arthropathy or other focal bone abnormality. Soft tissues are unremarkable.  IMPRESSION: Negative.   Electronically Signed By: Jasmine PangKim Fujinaga M.D. On: 01/23/2023 23:55  EXAM: LEFT KNEE - COMPLETE 4+ VIEW  COMPARISON: None Available.  FINDINGS: No evidence of fracture, dislocation, or joint effusion. No evidence of arthropathy or other focal bone abnormality. Soft tissues are unremarkable.  IMPRESSION: Negative.   Electronically Signed By: Jasmine PangKim Fujinaga M.D. On: 01/23/2023 23:53  EXAM: RIGHT ELBOW - COMPLETE 3+ VIEW  COMPARISON: None Available.  FINDINGS: There is no evidence of fracture, dislocation, or joint effusion. There is no evidence of arthropathy or other focal bone abnormality. Soft tissues are unremarkable.  IMPRESSION: Negative.   Electronically Signed By: Jasmine PangKim Fujinaga M.D. On:  01/23/2023 23:54  EXAM: LEFT ELBOW - COMPLETE 3+ VIEW  COMPARISON: None Available.  FINDINGS: There is no evidence of fracture, dislocation, or joint effusion. There is no evidence of arthropathy or other focal bone abnormality. Soft tissues are unremarkable.  IMPRESSION: Negative.   Electronically Signed By: Jasmine PangKim Fujinaga M.D. On: 01/23/2023 23:53   Past Medical/Family/Surgical/Social History: Medications & Allergies reviewed per EMR, new medications updated. 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   Past Medical History:  Diagnosis Date   Anxiety disorder 07/16/2021   Arthritis    Asthma    Cardiac murmur 08/26/2021   Chronic bilateral low back pain without sciatica 12/29/2015   Gastroesophageal reflux disease 07/16/2021   Gestational hypertension 07/06/2021  Lumbosacral spondylosis 07/16/2021   Migraine 07/16/2021   Morbid obesity (HCC) 08/26/2021   Palpitations 08/26/2021   Pars defect of lumbar spine    Pregnancy 07/08/2021   Pregnancy induced hypertension    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   Family History  Problem Relation Age of Onset   Miscarriages / Stillbirths Mother    Hypertension Mother    Heart disease Mother    Depression Mother    Anxiety disorder Mother    Stroke Mother    Heart attack Mother    Drug abuse Father    Alcohol abuse Father    Miscarriages / [place]  Sister    Asthma Sister    Arthritis Sister    Anxiety disorder Sister    Drug abuse Brother    Alcohol abuse Brother    ADD / ADHD Brother    Anxiety disorder Maternal Aunt    Hypertension Maternal Uncle    Anxiety disorder Paternal Aunt    Anxiety disorder Paternal Uncle    Diabetes Maternal Grandmother    Depression Maternal Grandmother    Hypertension Maternal Grandfather    Heart disease Maternal Grandfather    Cancer Paternal Grandmother    Anxiety disorder Paternal Grandmother    Past Surgical History:  Procedure Laterality Date   NO PAST SURGERIES     Social History   Occupational History   Occupation: Armed forces technical officer of [employer]    Employer: [employer]  Tobacco Use   Smoking status: Never   Smokeless tobacco: Never  Vaping Use   Vaping Use: Never used  Substance and Sexual Activity   Alcohol use: Never   Drug use: Never   Sexual activity: Yes    Birth control/protection: None

## 2023-01-24 NOTE — Progress Notes (Signed)
Larey Seat 01/23/23 Went to [facility] hospital Left worse than right No swelling  Denies medication Numbness into both feet

## 2023-02-22 ENCOUNTER — Ambulatory Visit: Payer: BC Managed Care – PPO | Admitting: Cardiology

## 2023-03-14 NOTE — Progress Notes (Unsigned)
Cardiology Office Note:    Date:  03/15/2023   ID:  Tanya Schmitt, DOB 19-May-2001, MRN [id number]  PCP:  Lise Auer, MD   [facility] Providers Cardiologist:  None     Referring MD: Lise Auer, MD   Chief Complaint  Patient presents with   Chest Pain    Same sx's as before but more frequent   Leg Swelling    History of Present Illness:    Tanya Schmitt is a 22 y.o. female with a hx of gestational hypertension, migraines, history of orthostatic hypotension, asthma, GERD, spondylitis of lumbosacral region, anxiety disorder, chronic low back pain, palpitations, murmur, morbid obesity.  She initially establish care with Dr. Tomie China in November 2022 for hypertension related to her pregnancy, she also does endorse palpitations.  An echocardiogram was arranged and completed on 09/06/2021 which revealed an EF 60 to 65%, mild concentric LVH, no valvular abnormalities.A long-term monitor was arranged and completed in November 2022 which revealed predominant underlying rhythm was sinus, isolated SVE's were rare at less than 1%.  She presents today with complaints of tachycardia, states her heart rate is always greater than 100 throughout the day, and this is very bothersome for her.  We reviewed her intake of potential stimulants throughout the day, she typically has 1-2 servings of soda/tea throughout the day, the remainder she drinks water. She does not use any OTC supplements, denies tobacco use or other stimulants. She endorses anxiety, was previously on propranolol and it made her feel much worse.  She endorses occasional pedal edema.  She denies chest pain, palpitations, dyspnea, pnd, orthopnea, n, v, dizziness, syncope, edema, weight gain, or early satiety.   Past Medical History:  Diagnosis Date   Anxiety disorder 07/16/2021   Arthritis    Asthma    Cardiac murmur 08/26/2021   Chronic bilateral low back pain without sciatica 12/29/2015   Gastroesophageal reflux  disease 07/16/2021   Gestational hypertension 07/06/2021   Lumbosacral spondylosis 07/16/2021   Migraine 07/16/2021   Morbid obesity (HCC) 08/26/2021   Orthostatic hypotension 02/23/2022   Palpitations 08/26/2021   Pars defect of lumbar spine    Pregnancy 07/08/2021   Pregnancy induced hypertension    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    Past Surgical History:  Procedure Laterality Date   NO PAST SURGERIES      Current Medications: Current Meds  Medication Sig   metoprolol succinate (TOPROL XL) 25 MG 24 hr tablet Take 1 tablet (25 mg total) by mouth every evening.     Allergies:   Cefdinir, Keflex [cephalexin], Penicillins, and Sulfa antibiotics   Social History   Socioeconomic History   Marital status: Single    Spouse name: Not on file   Number of children: Not on file   Years of education: Not on file   Highest education level: Not on file  Occupational History   Occupation: [employer]    Employer: [employer]  Tobacco Use   Smoking status: Never   Smokeless tobacco: Never  Vaping Use   Vaping Use: Never used  Substance and Sexual Activity   Alcohol use: Never   Drug use: Never   Sexual activity: Yes    Birth control/protection: None  Other Topics Concern   Not on file  Social History Narrative   Not on file   Social Determinants of Health   Financial Resource Strain: Not on file  Food Insecurity: Not on file  Transportation Needs: Not  on file  Physical Activity: Not on file  Stress: Not on file  Social Connections: Not on file     Family History: The patient's family history includes ADD / ADHD in her brother; Alcohol abuse in her brother and father; Anxiety disorder in her maternal aunt, mother, paternal aunt, paternal grandmother, paternal uncle, and sister; Arthritis in her sister; Asthma in her sister; Cancer in her paternal grandmother; Depression in her maternal grandmother and mother; Diabetes in her maternal  grandmother; Drug abuse in her brother and father; Heart attack in her mother; Heart disease in her maternal grandfather and mother; Hypertension in her maternal grandfather, maternal uncle, and mother; Miscarriages / Stillbirths in her mother and sister; Stroke in her mother.  ROS:   Please see the history of present illness.     All other systems reviewed and are negative.  EKGs/Labs/Other Studies Reviewed:    The following studies were reviewed today: Cardiac Studies & Procedures       ECHOCARDIOGRAM  ECHOCARDIOGRAM COMPLETE 09/06/2021  Narrative ECHOCARDIOGRAM REPORT    Patient Name:   Tanya Schmitt Date of Exam: 09/06/2021 Medical Rec #:  [id number]      Height:       63.0 in Accession #:    [id number]     Weight:       230.0 lb Date of Birth:  [birth year]     BSA:          2.052 m Patient Age:    19 years       BP:           116/78 mmHg Patient Gender: F              HR:           84 bpm. Exam Location:  [place]  Procedure: 2D Echo, Cardiac Doppler, Color Doppler and Strain Analysis  Indications:    Cardiac murmur [R01.1 (ICD-10-CM)]  History:        Patient has no prior history of Echocardiogram examinations. Signs/Symptoms:Morbid obesity; Risk Factors:Gestational hypertension.  Sonographer:    Louie Boston RDCS Referring Phys: Rito Ehrlich [facility]  IMPRESSIONS   1. Left ventricular ejection fraction, by estimation, is 60 to 65%. The left ventricle has normal function. The left ventricle has no regional wall motion abnormalities. There is mild concentric left ventricular hypertrophy. Left ventricular diastolic parameters were normal. 2. Right ventricular systolic function is normal. The right ventricular size is normal. Tricuspid regurgitation signal is inadequate for assessing PA pressure. 3. The mitral valve is normal in structure. No evidence of mitral valve regurgitation. No evidence of mitral stenosis. 4. The aortic valve is tricuspid. Aortic valve  regurgitation is not visualized. No aortic stenosis is present. 5. The inferior vena cava is normal in size with greater than 50% respiratory variability, suggesting right atrial pressure of 3 mmHg.  FINDINGS Left Ventricle: Left ventricular ejection fraction, by estimation, is 60 to 65%. The left ventricle has normal function. The left ventricle has no regional wall motion abnormalities. The left ventricular internal cavity size was normal in size. There is mild concentric left ventricular hypertrophy. Left ventricular diastolic parameters were normal. Normal left ventricular filling pressure.  Right Ventricle: The right ventricular size is normal. No increase in right ventricular wall thickness. Right ventricular systolic function is normal. Tricuspid regurgitation signal is inadequate for assessing PA pressure. The tricuspid regurgitant velocity is 2.77 m/s, and with an assumed right atrial pressure of 3 mmHg, the estimated right ventricular  systolic pressure is 33.7 mmHg.  Left Atrium: Left atrial size was normal in size.  Right Atrium: Right atrial size was normal in size.  Pericardium: There is no evidence of pericardial effusion.  Mitral Valve: The mitral valve is normal in structure. No evidence of mitral valve regurgitation. No evidence of mitral valve stenosis.  Tricuspid Valve: The tricuspid valve is normal in structure. Tricuspid valve regurgitation is trivial. No evidence of tricuspid stenosis.  Aortic Valve: The aortic valve is tricuspid. Aortic valve regurgitation is not visualized. No aortic stenosis is present.  Pulmonic Valve: The pulmonic valve was normal in structure. Pulmonic valve regurgitation is not visualized. No evidence of pulmonic stenosis.  Aorta: The aortic root, ascending aorta, aortic arch and descending aorta are all structurally normal, with no evidence of dilitation or obstruction.  Venous: The pulmonary veins were not well visualized. The inferior vena  cava is normal in size with greater than 50% respiratory variability, suggesting right atrial pressure of 3 mmHg.  IAS/Shunts: No atrial level shunt detected by color flow Doppler.   LEFT VENTRICLE PLAX 2D LVIDd:         4.60 cm   Diastology LVIDs:         2.60 cm   LV e' medial:    8.81 cm/s LV PW:         1.00 cm   LV E/e' medial:  11.2 LV IVS:        1.00 cm   LV e' lateral:   11.70 cm/s LVOT diam:     1.90 cm   LV E/e' lateral: 8.4 LV SV:         52 LV SV Index:   26        2D Longitudinal Strain LVOT Area:     2.84 cm  2D Strain GLS Avg:     -14.3 %   RIGHT VENTRICLE             IVC RV S prime:     11.30 cm/s  IVC diam: 2.00 cm TAPSE (M-mode): 2.3 cm  LEFT ATRIUM             Index        RIGHT ATRIUM           Index LA diam:        3.40 cm 1.66 cm/m   RA Area:     13.40 cm LA Vol (A2C):   34.8 ml 16.96 ml/m  RA Volume:   28.80 ml  14.04 ml/m LA Vol (A4C):   36.9 ml 17.98 ml/m LA Biplane Vol: 37.6 ml 18.32 ml/m AORTIC VALVE LVOT Vmax:   85.80 cm/s LVOT Vmean:  59.400 cm/s LVOT VTI:    0.185 m  AORTA Ao Root diam: 3.00 cm Ao Desc diam: 1.90 cm  MITRAL VALVE               TRICUSPID VALVE MV Area (PHT): 3.76 cm    TR Peak grad:   30.7 mmHg MV Decel Time: 202 msec    TR Vmax:        277.00 cm/s MV E velocity: 98.50 cm/s MV A velocity: 56.30 cm/s  SHUNTS MV E/A ratio:  1.75        Systemic VTI:  0.18 m Systemic Diam: 1.90 cm  Norman Herrlich MD Electronically signed by Norman Herrlich MD Signature Date/Time: 09/06/2021/12:09:44 PM    Final    MONITORS  LONG TERM MONITOR (3-14 DAYS) 09/21/2021  Narrative Patch  Wear Time:  14 days and 0 hours (2022-11-10T14:29:41-0500 to 2022-11-24T14:29:45-0500)  Patient had a min HR of 45 bpm, max HR of 161 bpm, and avg HR of 87 bpm.  Predominant underlying rhythm was Sinus Rhythm. Isolated SVEs were rare (<1.0%), and no SVE Couplets or SVE Triplets were present.  Isolated VEs were rare (<1.0%), and no VE Couplets or VE  Triplets were present.  Impression: Mildly abnormal and unremarkable event monitor.  Patient symptoms did not correlate with any findings on the monitoring.            EKG:  EKG is  ordered today.  The ekg ordered today demonstrates SR, HR 100 bpm.  Recent Labs: No results found for requested labs within last 365 days.  Recent Lipid Panel No results found for: "CHOL", "TRIG", "HDL", "CHOLHDL", "VLDL", "LDLCALC", "LDLDIRECT"   Risk Assessment/Calculations:                Physical Exam:    VS:  BP 118/78 (BP Location: Left Arm, Patient Position: Sitting)   Pulse 100   Ht 5\' 3"  (1.6 m)   Wt 237 lb 9.6 oz (107.8 kg)   SpO2 99%   BMI 42.09 kg/m     Wt Readings from Last 3 Encounters:  03/15/23 237 lb 9.6 oz (107.8 kg)  02/23/22 231 lb (104.8 kg)  08/26/21 230 lb (104.3 kg)     GEN:  Well nourished, well developed in no acute distress HEENT: Normal NECK: No JVD; No carotid bruits LYMPHATICS: No lymphadenopathy CARDIAC: RRR, no murmurs, rubs, gallops RESPIRATORY:  Clear to auscultation without rales, wheezing or rhonchi  ABDOMEN: Soft, non-tender, non-distended MUSCULOSKELETAL:  No edema appreciated upon exam today; No deformity  SKIN: Warm and dry NEUROLOGIC:  Alert and oriented x 3 PSYCHIATRIC:  Normal affect   ASSESSMENT:    1. Tachycardia   2. Encounter for vitamin deficiency screening   3. Other fatigue    PLAN:    In order of problems listed above:  Tachycardia-previously wore monitor which was unrevealing.  Heart rate in the office today is 100 bpm, patient states it is consistently greater than 100 or 110 throughout the day.  She predominately avoid stimulants, may have 1 or 2 servings of caffeine per day, we discussed cessation of this to see if this would help.  She had previously been on propranolol which made her feel fatigued.  Will try metoprolol 25 mg to take each evening.  Will check TSH. Fatigue-this is likely multifactorial, she has a small  child.  Could be a component of anxiety/depression, she will discuss this further with her PCP.  For now, will check BMET, CBC, vitamin D, vitamin B12.   Disposition-Labs per above, start metoprolol 25 mg daily.  Return in 3 months.          Medication Adjustments/Labs and Tests Ordered: Current medicines are reviewed at length with the patient today.  Concerns regarding medicines are outlined above.  Orders Placed This Encounter  Procedures   Basic metabolic panel   CBC with Differential/Platelet   TSH   VITAMIN D 25 Hydroxy (Vit-D Deficiency, Fractures)   B12 and Folate Panel   EKG 12-Lead   Meds ordered this encounter  Medications   metoprolol succinate (TOPROL XL) 25 MG 24 hr tablet    Sig: Take 1 tablet (25 mg total) by mouth every evening.    Dispense:  90 tablet    Refill:  3    Patient Instructions  Medication  Instructions:  Your physician has recommended you make the following change in your medication:  Start taking Metoprolol succinate 25 mg every evening  *If you need a refill on your cardiac medications before your next appointment, please call your pharmacy*   Lab Work: Your physician recommends that you return for lab work in: Today for BMP, CBC, TSH, Vit D and Vit B12  If you have labs (blood work) drawn today and your tests are completely normal, you will receive your results only by: MyChart Message (if you have MyChart) OR A paper copy in the mail If you have any lab test that is abnormal or we need to change your treatment, we will call you to review the results.   Testing/Procedures: NONE   Follow-Up: At [facility], you and your health needs are our priority.  As part of our continuing mission to provide you with exceptional heart care, we have created designated Provider Care Teams.  These Care Teams include your primary Cardiologist (physician) and Advanced Practice Providers (APPs -  Physician Assistants and Nurse Practitioners) who  all work together to provide you with the care you need, when you need it.  We recommend signing up for the patient portal called "MyChart".  Sign up information is provided on this After Visit Summary.  MyChart is used to connect with patients for Virtual Visits (Telemedicine).  Patients are able to view lab/test results, encounter notes, upcoming appointments, etc.  Non-urgent messages can be sent to your provider as well.   To learn more about what you can do with MyChart, go to [email or web address].    Your next appointment:   3 month(s)  Provider:   Belva Crome, MD    Other Instructions Metoprolol Extended-Release Capsules What is this medication? METOPROLOL (me TOE proe lole) treats high blood pressure and heart failure. It may also be used to prevent chest pain (angina). It works by lowering your blood pressure and heart rate, making it easier for your heart to pump blood to the rest of your body. It belongs to a group of medications called beta blockers. This medicine may be used for other purposes; ask your health care provider or pharmacist if you have questions. COMMON BRAND NAME(S): [facility] What should I tell my care team before I take this medication? They need to know if you have any of these conditions: Diabetes Heart disease Liver disease Lung or breathing disease, such as asthma Pheochromocytoma Thyroid disease An unusual or allergic reaction to metoprolol, other medications, foods, dyes, or preservatives Pregnant or trying to get pregnant Breastfeeding How should I use this medication? Take this medication by mouth with water. Take it as directed on the prescription label at the same time every day. Do not cut, crush, or chew this medication. Swallow the capsules whole. You may open the capsule and put the contents in 1 teaspoon of applesauce. Swallow the medication and applesauce right away. Do not chew the medication or applesauce. Keep taking it unless your  care team tells you to stop. Talk to your care team about the use of this medication in children. While it may be prescribed for children as young as 6 years for selected conditions, precautions do apply. Overdosage: If you think you have taken too much of this medicine contact a poison control center or emergency room at once. NOTE: This medicine is only for you. Do not share this medicine with others. What if I miss a dose? If you miss a dose, take  it as soon as you can. If it is almost time for your next dose, take only that dose. Do not take double or extra doses. What may interact with this medication? Certain medications for blood pressure, heart disease, irregular heartbeat NSAIDS, medications for pain and inflammation, such as ibuprofen or naproxen This list may not describe all possible interactions. Give your health care provider a list of all the medicines, herbs, non-prescription drugs, or dietary supplements you use. Also tell them if you smoke, drink alcohol, or use illegal drugs. Some items may interact with your medicine. What should I watch for while using this medication? Visit your care team for regular checks on your progress. Check your blood pressure as directed. Know what your blood pressure should be and when to contact your care team. This medication may affect your coordination, reaction time, or judgment. Do not drive or operate machinery until you know how this medication affects you. Sit up or stand slowly to reduce the risk of dizzy or fainting spells. Drinking alcohol with this medication can increase the risk of these side effects. Do not suddenly stop taking this medication. This may increase your risk of side effects, such as chest pain and heart attack. If you no longer need to take this medication, your care team will lower the dose slowly over time to decrease the risk of side effects. If you are going to need surgery or a procedure, tell your care team that you are  using this medication. This medication may affect blood glucose levels. It can also mask the symptoms of low blood sugar, such as a rapid heartbeat and tremors. If you have diabetes, it is important to check your blood sugar often while you are taking this medication. Do not treat yourself for coughs, colds, or pain while you are using this medication without asking your care team for advice. Some medications may increase your blood pressure. What side effects may I notice from receiving this medication? Side effects that you should report to your care team as soon as possible: Allergic reactions--skin rash, itching, hives, swelling of the face, lips, tongue, or throat Heart failure--shortness of breath, swelling of the ankles, feet, or hands, sudden weight gain, unusual weakness or fatigue Low blood pressure--dizziness, feeling faint or lightheaded, blurry vision Raynaud's--cool, numb, or painful fingers or toes that may change color from pale, to blue, to red Slow heartbeat--dizziness, feeling faint or lightheaded, confusion, trouble breathing, unusual weakness or fatigue Worsening mood, feelings of depression Side effects that usually do not require medical attention (report to your care team if they continue or are bothersome): Change in sex drive or performance Diarrhea Dizziness Fatigue Headache This list may not describe all possible side effects. Call your doctor for medical advice about side effects. You may report side effects to FDA at 1-800-FDA-1088. Where should I keep my medication? Keep out of the reach of children and pets. Store at room temperature between 20 and 25 degrees C (68 and 77 degrees F). Throw away any unused medication after the expiration date. NOTE: This sheet is a summary. It may not cover all possible information. If you have questions about this medicine, talk to your doctor, pharmacist, or health care provider.  2024 Elsevier/Gold Standard (2022-10-03  00:00:00)      Signed, Flossie Dibble, NP  03/15/2023 3:20 PM    [facility]

## 2023-03-15 ENCOUNTER — Encounter: Payer: Self-pay | Admitting: Cardiology

## 2023-03-15 ENCOUNTER — Ambulatory Visit: Payer: BC Managed Care – PPO | Attending: Cardiology | Admitting: Cardiology

## 2023-03-15 VITALS — BP 118/78 | HR 100 | Ht 63.0 in | Wt 237.6 lb

## 2023-03-15 DIAGNOSIS — Z1321 Encounter for screening for nutritional disorder: Secondary | ICD-10-CM | POA: Diagnosis not present

## 2023-03-15 DIAGNOSIS — Z79899 Other long term (current) drug therapy: Secondary | ICD-10-CM | POA: Diagnosis not present

## 2023-03-15 DIAGNOSIS — I10 Essential (primary) hypertension: Secondary | ICD-10-CM

## 2023-03-15 DIAGNOSIS — R000 Tachycardia, unspecified: Secondary | ICD-10-CM | POA: Diagnosis not present

## 2023-03-15 DIAGNOSIS — R002 Palpitations: Secondary | ICD-10-CM

## 2023-03-15 DIAGNOSIS — R5383 Other fatigue: Secondary | ICD-10-CM

## 2023-03-15 MED ORDER — METOPROLOL SUCCINATE ER 25 MG PO TB24: 25.0000 mg | ORAL_TABLET | Freq: Every evening | ORAL | 3 refills | Status: AC

## 2023-03-15 NOTE — Patient Instructions (Signed)
Medication Instructions:  Your physician has recommended you make the following change in your medication:  Start taking Metoprolol succinate 25 mg every evening  *If you need a refill on your cardiac medications before your next appointment, please call your pharmacy*   Lab Work: Your physician recommends that you return for lab work in: Today for BMP, CBC, TSH, Vit D and Vit B12  If you have labs (blood work) drawn today and your tests are completely normal, you will receive your results only by: MyChart Message (if you have MyChart) OR A paper copy in the mail If you have any lab test that is abnormal or we need to change your treatment, we will call you to review the results.   Testing/Procedures: NONE   Follow-Up: At [facility], you and your health needs are our priority.  As part of our continuing mission to provide you with exceptional heart care, we have created designated Provider Care Teams.  These Care Teams include your primary Cardiologist (physician) and Advanced Practice Providers (APPs -  Physician Assistants and Nurse Practitioners) who all work together to provide you with the care you need, when you need it.  We recommend signing up for the patient portal called "MyChart".  Sign up information is provided on this After Visit Summary.  MyChart is used to connect with patients for Virtual Visits (Telemedicine).  Patients are able to view lab/test results, encounter notes, upcoming appointments, etc.  Non-urgent messages can be sent to your provider as well.   To learn more about what you can do with MyChart, go to [email or web address].    Your next appointment:   3 month(s)  Provider:   Belva Crome, MD    Other Instructions Metoprolol Extended-Release Capsules What is this medication? METOPROLOL (me TOE proe lole) treats high blood pressure and heart failure. It may also be used to prevent chest pain (angina). It works by lowering your blood  pressure and heart rate, making it easier for your heart to pump blood to the rest of your body. It belongs to a group of medications called beta blockers. This medicine may be used for other purposes; ask your health care provider or pharmacist if you have questions. COMMON BRAND NAME(S): [facility] What should I tell my care team before I take this medication? They need to know if you have any of these conditions: Diabetes Heart disease Liver disease Lung or breathing disease, such as asthma Pheochromocytoma Thyroid disease An unusual or allergic reaction to metoprolol, other medications, foods, dyes, or preservatives Pregnant or trying to get pregnant Breastfeeding How should I use this medication? Take this medication by mouth with water. Take it as directed on the prescription label at the same time every day. Do not cut, crush, or chew this medication. Swallow the capsules whole. You may open the capsule and put the contents in 1 teaspoon of applesauce. Swallow the medication and applesauce right away. Do not chew the medication or applesauce. Keep taking it unless your care team tells you to stop. Talk to your care team about the use of this medication in children. While it may be prescribed for children as young as 6 years for selected conditions, precautions do apply. Overdosage: If you think you have taken too much of this medicine contact a poison control center or emergency room at once. NOTE: This medicine is only for you. Do not share this medicine with others. What if I miss a dose? If you miss a dose, take  it as soon as you can. If it is almost time for your next dose, take only that dose. Do not take double or extra doses. What may interact with this medication? Certain medications for blood pressure, heart disease, irregular heartbeat NSAIDS, medications for pain and inflammation, such as ibuprofen or naproxen This list may not describe all possible interactions. Give your  health care provider a list of all the medicines, herbs, non-prescription drugs, or dietary supplements you use. Also tell them if you smoke, drink alcohol, or use illegal drugs. Some items may interact with your medicine. What should I watch for while using this medication? Visit your care team for regular checks on your progress. Check your blood pressure as directed. Know what your blood pressure should be and when to contact your care team. This medication may affect your coordination, reaction time, or judgment. Do not drive or operate machinery until you know how this medication affects you. Sit up or stand slowly to reduce the risk of dizzy or fainting spells. Drinking alcohol with this medication can increase the risk of these side effects. Do not suddenly stop taking this medication. This may increase your risk of side effects, such as chest pain and heart attack. If you no longer need to take this medication, your care team will lower the dose slowly over time to decrease the risk of side effects. If you are going to need surgery or a procedure, tell your care team that you are using this medication. This medication may affect blood glucose levels. It can also mask the symptoms of low blood sugar, such as a rapid heartbeat and tremors. If you have diabetes, it is important to check your blood sugar often while you are taking this medication. Do not treat yourself for coughs, colds, or pain while you are using this medication without asking your care team for advice. Some medications may increase your blood pressure. What side effects may I notice from receiving this medication? Side effects that you should report to your care team as soon as possible: Allergic reactions--skin rash, itching, hives, swelling of the face, lips, tongue, or throat Heart failure--shortness of breath, swelling of the ankles, feet, or hands, sudden weight gain, unusual weakness or fatigue Low blood pressure--dizziness,  feeling faint or lightheaded, blurry vision Raynaud's--cool, numb, or painful fingers or toes that may change color from pale, to blue, to red Slow heartbeat--dizziness, feeling faint or lightheaded, confusion, trouble breathing, unusual weakness or fatigue Worsening mood, feelings of depression Side effects that usually do not require medical attention (report to your care team if they continue or are bothersome): Change in sex drive or performance Diarrhea Dizziness Fatigue Headache This list may not describe all possible side effects. Call your doctor for medical advice about side effects. You may report side effects to FDA at 1-800-FDA-1088. Where should I keep my medication? Keep out of the reach of children and pets. Store at room temperature between 20 and 25 degrees C (68 and 77 degrees F). Throw away any unused medication after the expiration date. NOTE: This sheet is a summary. It may not cover all possible information. If you have questions about this medicine, talk to your doctor, pharmacist, or health care provider.  2024 [employer] Standard (2022-10-03 00:00:00)

## 2023-03-16 LAB — VITAMIN D 25 HYDROXY (VIT D DEFICIENCY, FRACTURES): Vit D, 25-Hydroxy: 23.6 ng/mL — ABNORMAL LOW (ref 30.0–100.0)

## 2023-03-16 LAB — BASIC METABOLIC PANEL WITH GFR
BUN/Creatinine Ratio: 17 (ref 9–23)
BUN: 13 mg/dL (ref 6–20)
CO2: 22 mmol/L (ref 20–29)
Calcium: 8.8 mg/dL (ref 8.7–10.2)
Chloride: 105 mmol/L (ref 96–106)
Creatinine, Ser: 0.76 mg/dL (ref 0.57–1.00)
Glucose: 100 mg/dL — ABNORMAL HIGH (ref 70–99)
Potassium: 4.3 mmol/L (ref 3.5–5.2)
Sodium: 140 mmol/L (ref 134–144)
eGFR: 113 mL/min/1.73

## 2023-03-16 LAB — CBC WITH DIFFERENTIAL/PLATELET
Basophils Absolute: 0.1 x10E3/uL (ref 0.0–0.2)
Basos: 1 %
EOS (ABSOLUTE): 0.1 x10E3/uL (ref 0.0–0.4)
Eos: 2 %
Hematocrit: 40.5 % (ref 34.0–46.6)
Hemoglobin: 13.6 g/dL (ref 11.1–15.9)
Immature Grans (Abs): 0 x10E3/uL (ref 0.0–0.1)
Immature Granulocytes: 0 %
Lymphocytes Absolute: 2.7 x10E3/uL (ref 0.7–3.1)
Lymphs: 33 %
MCH: 30.6 pg (ref 26.6–33.0)
MCHC: 33.6 g/dL (ref 31.5–35.7)
MCV: 91 fL (ref 79–97)
Monocytes Absolute: 0.5 x10E3/uL (ref 0.1–0.9)
Monocytes: 6 %
Neutrophils Absolute: 4.8 x10E3/uL (ref 1.4–7.0)
Neutrophils: 58 %
Platelets: 326 x10E3/uL (ref 150–450)
RBC: 4.45 x10E6/uL (ref 3.77–5.28)
RDW: 11.8 % (ref 11.7–15.4)
WBC: 8.2 x10E3/uL (ref 3.4–10.8)

## 2023-03-16 LAB — TSH: TSH: 2.17 u[IU]/mL (ref 0.450–4.500)

## 2023-03-16 LAB — B12 AND FOLATE PANEL
Folate: 2.8 ng/mL — ABNORMAL LOW
Vitamin B-12: 275 pg/mL (ref 232–1245)

## 2023-06-14 ENCOUNTER — Telehealth: Payer: Self-pay | Admitting: Cardiology

## 2023-06-14 NOTE — Telephone Encounter (Signed)
  The patient had to cancel her appointment as she will be out of town tomorrow. She requested that Victorino Dike be informed that her medication is working well and if she can follow-up next year

## 2023-06-15 ENCOUNTER — Ambulatory Visit: Payer: BC Managed Care – PPO | Admitting: Cardiology
